# Patient Record
Sex: Male | Born: 1955
Health system: Southern US, Community
[De-identification: ages and names within clinical notes are randomized; demographics above are authoritative.]

## PROBLEM LIST (undated history)

## (undated) DIAGNOSIS — N453 Epididymo-orchitis: Secondary | ICD-10-CM

## (undated) DIAGNOSIS — I10 Essential (primary) hypertension: Secondary | ICD-10-CM

## (undated) HISTORY — DX: Epididymo-orchitis: N45.3

## (undated) HISTORY — DX: Essential (primary) hypertension: I10

---

## 2001-09-20 ENCOUNTER — Encounter: Payer: Self-pay | Admitting: Family Medicine

## 2001-09-20 ENCOUNTER — Encounter: Admission: RE | Admit: 2001-09-20 | Discharge: 2001-09-20 | Payer: Self-pay | Admitting: Family Medicine

## 2001-09-25 ENCOUNTER — Encounter: Payer: Self-pay | Admitting: Family Medicine

## 2001-09-25 ENCOUNTER — Encounter: Admission: RE | Admit: 2001-09-25 | Discharge: 2001-09-25 | Payer: Self-pay | Admitting: Family Medicine

## 2002-11-04 ENCOUNTER — Encounter: Payer: Self-pay | Admitting: Emergency Medicine

## 2002-11-04 ENCOUNTER — Emergency Department (HOSPITAL_COMMUNITY): Admission: AD | Admit: 2002-11-04 | Discharge: 2002-11-04 | Payer: Self-pay | Admitting: Emergency Medicine

## 2002-11-17 ENCOUNTER — Ambulatory Visit (HOSPITAL_COMMUNITY): Admission: RE | Admit: 2002-11-17 | Discharge: 2002-11-17 | Payer: Self-pay | Admitting: Urology

## 2003-01-10 HISTORY — PX: HAND SURGERY: SHX662

## 2003-02-14 ENCOUNTER — Observation Stay (HOSPITAL_COMMUNITY): Admission: EM | Admit: 2003-02-14 | Discharge: 2003-02-15 | Payer: Self-pay | Admitting: Emergency Medicine

## 2004-01-10 HISTORY — PX: OTHER SURGICAL HISTORY: SHX169

## 2005-01-13 IMAGING — CT CT ABDOMEN W/O CM
1 series · 15 of 32 positions shown, 19 images · non-contrast
Comparison: None.

CLINICAL DATA: Left flank pain. 

 CT ABDOMEN AND PELVIS, WITHOUT CONTRAST - 11/04/02 (8955 HOURS)
TECHNIQUE: 5 mm spiral images were obtained through the abdomen and pelvis without contrast.  
 CT ABDOMEN

[Series 2: renal stone · axial · 0.66mm/px · z∈[-432,-82]mm · 15 of 78 slices shown, 19 images]
[im 5/78  soft-tissue]
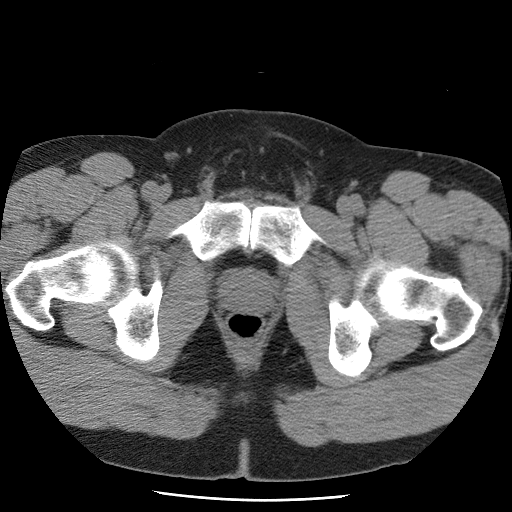
[im 5/78  bone]
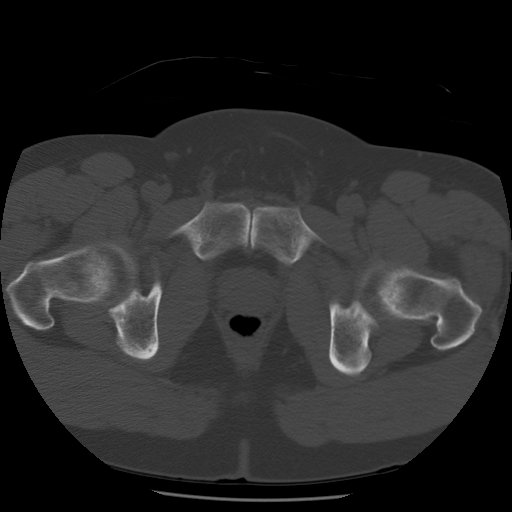
[im 10/78  soft-tissue]
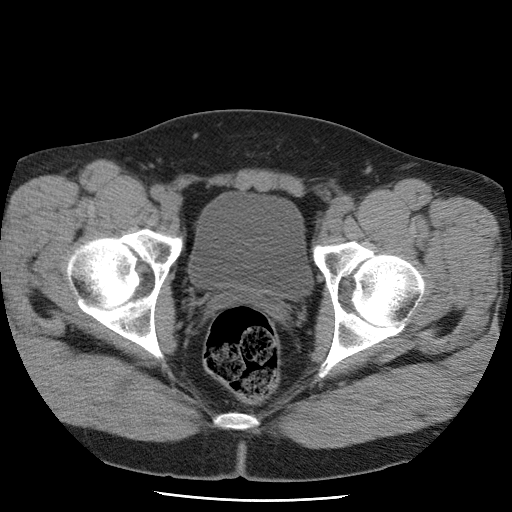
[im 15/78  soft-tissue]
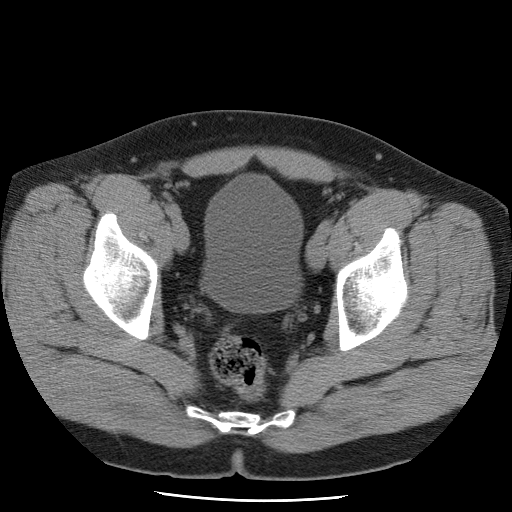
[im 23/78  soft-tissue]
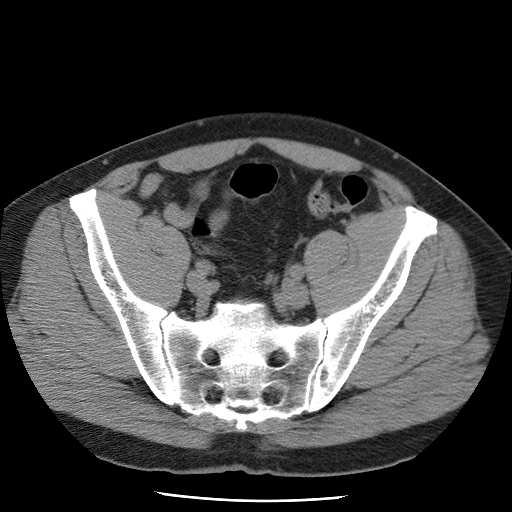
[im 28/78  soft-tissue]
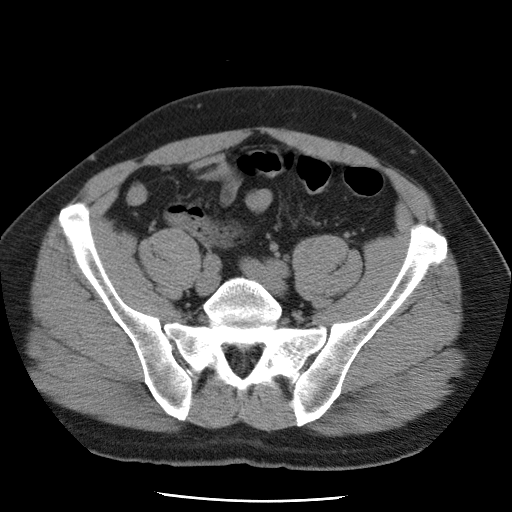
[im 33/78  soft-tissue]
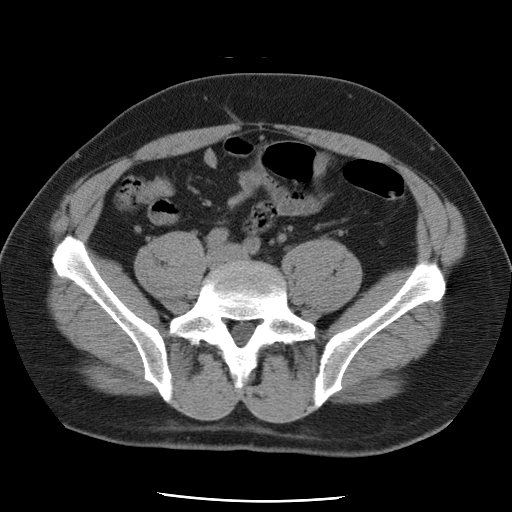
[im 40/78  soft-tissue]
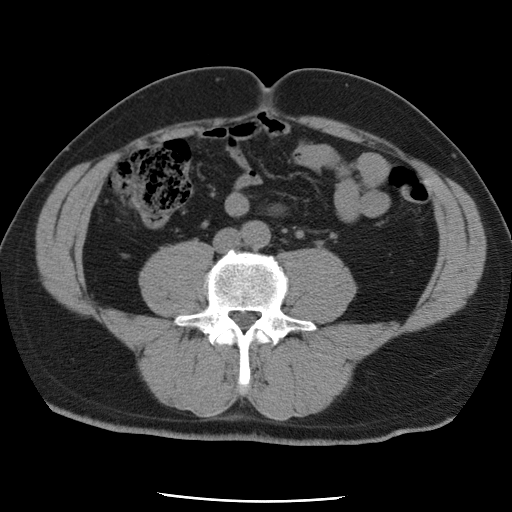
[im 45/78  soft-tissue]
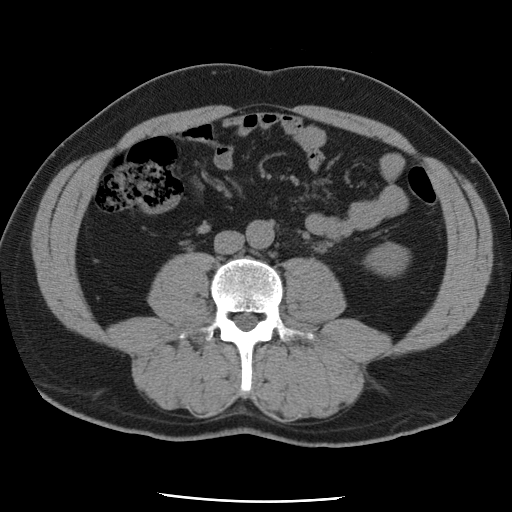
[im 50/78  soft-tissue]
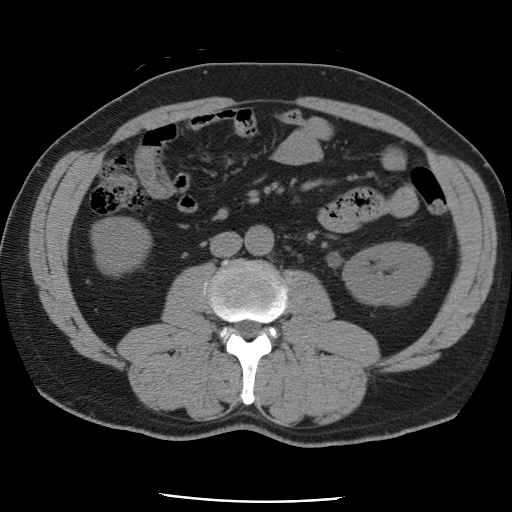
[im 50/78  bone]
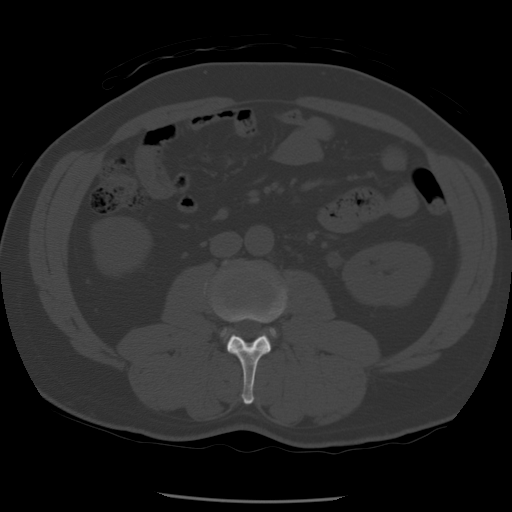
[im 55/78  soft-tissue]
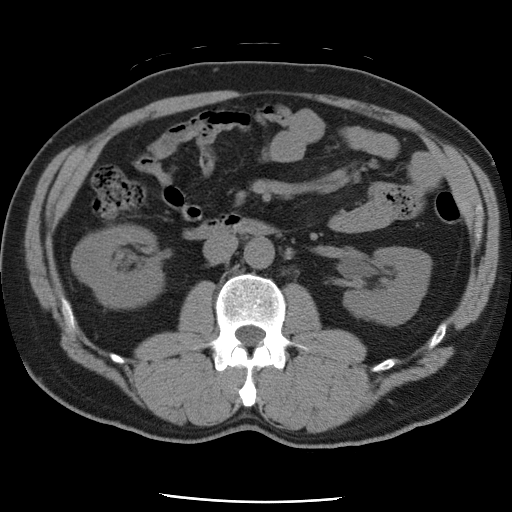
[im 63/78  soft-tissue]
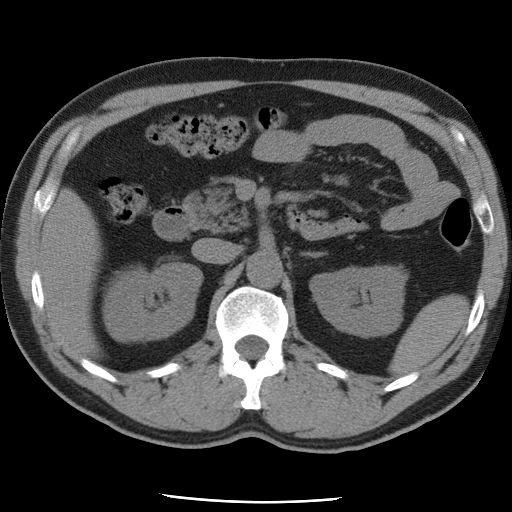
[im 68/78  soft-tissue]
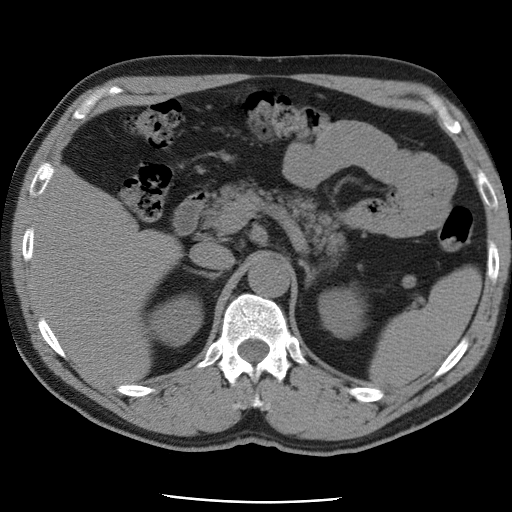
[im 68/78  lung]
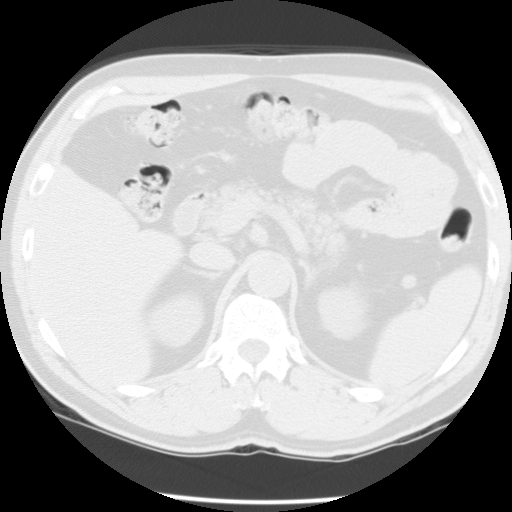
[im 70/78  lung]
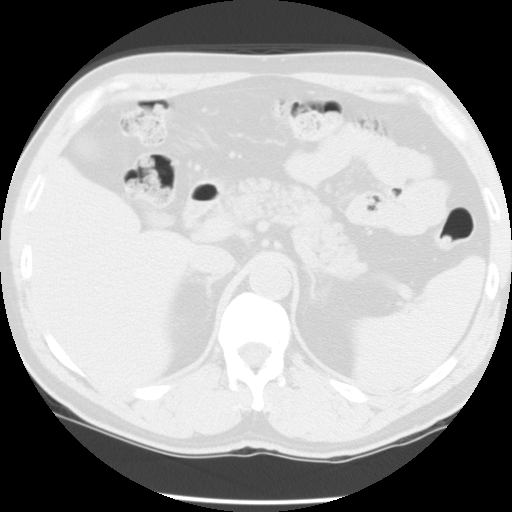
[im 73/78  soft-tissue]
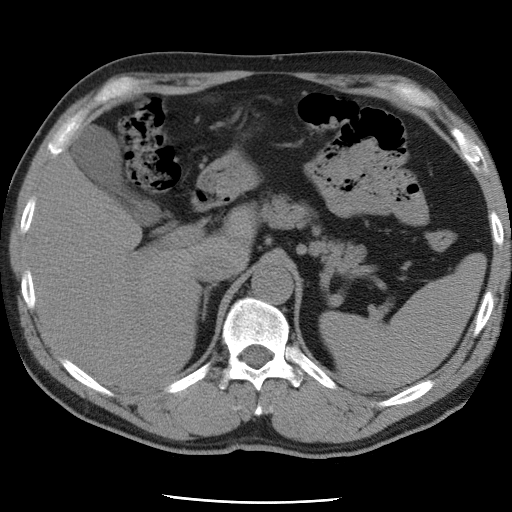
[im 73/78  lung]
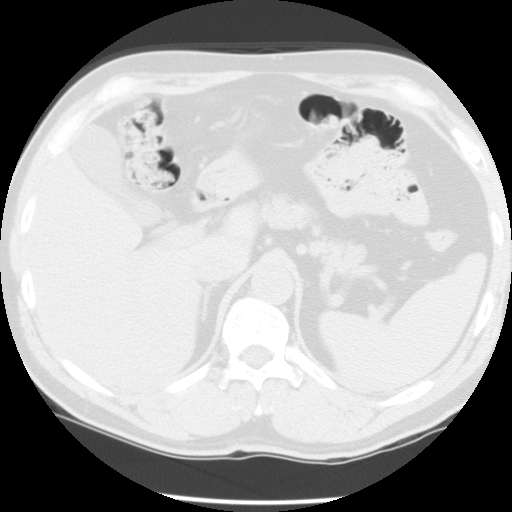
[im 75/78  lung]
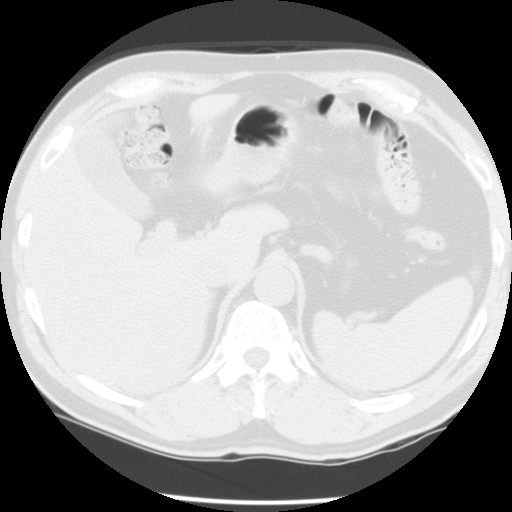

[15 of 32 positions shown; findings below may reference images not displayed]

FINDING: There is an 8 mm calculus in the lower pole of the left kidney.  There is no hydronephrosis or perinephric stranding.  There is however, slight stranding about the left ureter at the ureteropelvic junction and it is possible that this calculus may have traversed into the UPJ and back into the collecting system of the left kidney.  The right kidney.  The right kidney is within normal limits. 

 The gallbladder, pancreas and visualized portions of the spleen and liver are within normal limits. Negative free fluid.  Negative adenopathy.  

 IMPRESSION

 No ureteral calculi are seen.  

 There is slight stranding about the left ureter at the UPJ and it is possible that this calculus was in the ureter transiently and had traversed back into the lower pole collecting system on the left.  

 CT PELVIS
 The bladder and ureters are within normal limits. Negative free fluid.  Negative adenopathy.  Phleboliths are noted. 

 IMPRESSION
 No evidence of urinary calculus.

## 2008-08-13 ENCOUNTER — Ambulatory Visit: Payer: Self-pay | Admitting: Family Medicine

## 2008-08-13 DIAGNOSIS — I10 Essential (primary) hypertension: Secondary | ICD-10-CM | POA: Insufficient documentation

## 2008-08-13 HISTORY — DX: Essential (primary) hypertension: I10

## 2008-08-14 LAB — CONVERTED CEMR LAB
Albumin: 4.3 g/dL (ref 3.5–5.2)
Basophils Relative: 0.1 % (ref 0.0–3.0)
CO2: 30 meq/L (ref 19–32)
Chloride: 104 meq/L (ref 96–112)
Creatinine, Ser: 1.1 mg/dL (ref 0.4–1.5)
Eosinophils Absolute: 0.1 10*3/uL (ref 0.0–0.7)
HCT: 43.6 % (ref 39.0–52.0)
Hemoglobin: 15.3 g/dL (ref 13.0–17.0)
Lymphs Abs: 2.2 10*3/uL (ref 0.7–4.0)
MCHC: 35.2 g/dL (ref 30.0–36.0)
MCV: 87.8 fL (ref 78.0–100.0)
Monocytes Absolute: 0.7 10*3/uL (ref 0.1–1.0)
Neutro Abs: 4.6 10*3/uL (ref 1.4–7.7)
Neutrophils Relative %: 60.8 % (ref 43.0–77.0)
PSA: 0.14 ng/mL (ref 0.10–4.00)
RBC: 4.97 M/uL (ref 4.22–5.81)
Total CHOL/HDL Ratio: 5
Total Protein: 7.2 g/dL (ref 6.0–8.3)
Triglycerides: 97 mg/dL (ref 0.0–149.0)

## 2009-08-03 ENCOUNTER — Ambulatory Visit: Payer: Self-pay | Admitting: Family Medicine

## 2009-08-03 DIAGNOSIS — N453 Epididymo-orchitis: Secondary | ICD-10-CM

## 2009-08-03 HISTORY — DX: Epididymo-orchitis: N45.3

## 2009-09-21 ENCOUNTER — Ambulatory Visit: Payer: Self-pay | Admitting: Family Medicine

## 2009-09-21 LAB — CONVERTED CEMR LAB
ALT: 24 units/L (ref 0–53)
AST: 19 units/L (ref 0–37)
Albumin: 3.9 g/dL (ref 3.5–5.2)
Alkaline Phosphatase: 69 units/L (ref 39–117)
Basophils Absolute: 0.1 10*3/uL (ref 0.0–0.1)
Basophils Relative: 0.7 % (ref 0.0–3.0)
CO2: 33 meq/L — ABNORMAL HIGH (ref 19–32)
GFR calc non Af Amer: 89.49 mL/min (ref >60)
Glucose, Bld: 94 mg/dL (ref 70–99)
Glucose, Urine, Semiquant: NEGATIVE
HCT: 46.3 % (ref 39.0–52.0)
Hemoglobin: 15.8 g/dL (ref 13.0–17.0)
Lymphocytes Relative: 28.3 % (ref 12.0–46.0)
Lymphs Abs: 2.1 10*3/uL (ref 0.7–4.0)
Monocytes Relative: 7.6 % (ref 3.0–12.0)
Neutro Abs: 4.6 10*3/uL (ref 1.4–7.7)
Nitrite: NEGATIVE
Potassium: 3.9 meq/L (ref 3.5–5.1)
Protein, U semiquant: NEGATIVE
RBC: 5.1 M/uL (ref 4.22–5.81)
RDW: 12.9 % (ref 11.5–14.6)
Sodium: 143 meq/L (ref 135–145)
Specific Gravity, Urine: 1.015
TSH: 0.41 microintl units/mL (ref 0.35–5.50)
Total CHOL/HDL Ratio: 6
Total Protein: 6.6 g/dL (ref 6.0–8.3)
VLDL: 25.6 mg/dL (ref 0.0–40.0)
WBC Urine, dipstick: NEGATIVE
pH: 6

## 2009-09-27 ENCOUNTER — Ambulatory Visit: Payer: Self-pay | Admitting: Family Medicine

## 2009-09-29 ENCOUNTER — Ambulatory Visit: Payer: Self-pay | Admitting: Family Medicine

## 2009-09-29 LAB — CONVERTED CEMR LAB: OCCULT 2: NEGATIVE

## 2010-02-08 NOTE — Assessment & Plan Note (Signed)
Summary: CPX // RS   Vital Signs:  Patient profile:   55 year old male Height:      70 inches Weight:      211 pounds BMI:     30.38 O2 Sat:      96 % Temp:     98.5 degrees F oral Pulse rhythm:   regular Resp:     16 per minute BP sitting:   142 / 90  Vitals Entered By: Lynann Beaver CMA (September 27, 2009 2:05 PM)  Nutrition Counseling: Patient's BMI is greater than 25 and therefore counseled on weight management options. CC: cpx, Hypertension Management Is Patient Diabetic? No Pain Assessment Patient in pain? no        History of Present Illness: Patient for complete physical examination. Remote history of colonoscopy 2004. Recently completing Hemoccult cards and plans to send those back soon. Tetanus up-to-date. Flu vaccine offered and pt declines.  Hypertension treated with hydrochlorothiazide and quinapril. Patient compliant with medications. No regular exercise.  Hypertension History:      He denies headache, chest pain, palpitations, dyspnea with exertion, orthopnea, PND, peripheral edema, visual symptoms, neurologic problems, syncope, and side effects from treatment.  He notes no problems with any antihypertensive medication side effects.        Positive major cardiovascular risk factors include male age 42 years old or older and hypertension.  Negative major cardiovascular risk factors include non-tobacco-user status.     Clinical Review Panels:  Prevention   Last Colonoscopy:  normal (01/09/2002)   Last PSA:  0.14 (09/21/2009)  Immunizations   Last Tetanus Booster:  Historical (01/10/2003)  Lipid Management   Cholesterol:  147 (09/21/2009)   LDL (bad choesterol):  97 (09/21/2009)   HDL (good cholesterol):  24.50 (09/21/2009)  Diabetes Management   Creatinine:  0.9 (09/21/2009)  CBC   WBC:  7.4 (09/21/2009)   RBC:  5.10 (09/21/2009)   Hgb:  15.8 (09/21/2009)   Hct:  46.3 (09/21/2009)   Platelets:  262.0 (09/21/2009)   MCV  90.9 (09/21/2009)  MCHC  34.0 (09/21/2009)   RDW  12.9 (09/21/2009)   PMN:  61.9 (09/21/2009)   Lymphs:  28.3 (09/21/2009)   Monos:  7.6 (09/21/2009)   Eosinophils:  1.5 (09/21/2009)   Basophil:  0.7 (09/21/2009)  Complete Metabolic Panel   Glucose:  94 (09/21/2009)   Sodium:  143 (09/21/2009)   Potassium:  3.9 (09/21/2009)   Chloride:  105 (09/21/2009)   CO2:  33 (09/21/2009)   BUN:  12 (09/21/2009)   Creatinine:  0.9 (09/21/2009)   Albumin:  3.9 (09/21/2009)   Total Protein:  6.6 (09/21/2009)   Calcium:  9.0 (09/21/2009)   Total Bili:  0.7 (09/21/2009)   Alk Phos:  69 (09/21/2009)   SGPT (ALT):  24 (09/21/2009)   SGOT (AST):  19 (09/21/2009)   Allergies: 1)  Diphenhydramine Hcl (Diphenhydramine Hcl)  Past History:  Past Medical History: Last updated: 08/13/2008 Chicken pox Hay fever/allergies Hypertension Migraines Kidney stones  Past Surgical History: Last updated: 08/13/2008 Hand surgery 2005 crush injury  Lipotripsy for kidney stone 2006  Family History: Last updated: 08/13/2008 Family History of Alcoholism/Addiction Family History Breast cancer  Family History Diabetes  Family History Hypertension  Social History: Last updated: 08/13/2008 Occupation: currently unemployed Married Never Smoked Alcohol use-no  Risk Factors: Smoking Status: never (08/13/2008) PMH-FH-SH reviewed-no changes except otherwise noted  Review of Systems  The patient denies anorexia, fever, weight loss, weight gain, vision loss, decreased hearing, hoarseness, chest pain,  syncope, dyspnea on exertion, peripheral edema, prolonged cough, headaches, hemoptysis, abdominal pain, melena, hematochezia, severe indigestion/heartburn, hematuria, incontinence, genital sores, muscle weakness, suspicious skin lesions, transient blindness, difficulty walking, depression, unusual weight change, abnormal bleeding, enlarged lymph nodes, and testicular masses.    Physical Exam  General:   Well-developed,well-nourished,in no acute distress; alert,appropriate and cooperative throughout examination Head:  Normocephalic and atraumatic without obvious abnormalities. No apparent alopecia or balding. Eyes:  pupils equal, pupils round, and pupils reactive to light.   Ears:  External ear exam shows no significant lesions or deformities.  Otoscopic examination reveals clear canals, tympanic membranes are intact bilaterally without bulging, retraction, inflammation or discharge. Hearing is grossly normal bilaterally. Mouth:  Oral mucosa and oropharynx without lesions or exudates.  Teeth in good repair. Neck:  No deformities, masses, or tenderness noted. Lungs:  Normal respiratory effort, chest expands symmetrically. Lungs are clear to auscultation, no crackles or wheezes. Heart:  Normal rate and regular rhythm. S1 and S2 normal without gallop, murmur, click, rub or other extra sounds. Abdomen:  Bowel sounds positive,abdomen soft and non-tender without masses, organomegaly or hernias noted. Extremities:  No clubbing, cyanosis, edema, or deformity noted with normal full range of motion of all joints.   Neurologic:  alert & oriented X3, cranial nerves II-XII intact, strength normal in all extremities, and gait normal.   Skin:  no rashes and no suspicious lesions.   Cervical Nodes:  No lymphadenopathy noted Psych:  good eye contact, not anxious appearing, and not depressed appearing.     Impression & Recommendations:  Problem # 1:  ROUTINE GENERAL MEDICAL [email or web address]@[email or web address]  CARE FACL (ICD-V70.0) establish more regular exercise. Patient declines flu vaccine. Finish out Hemoccult cards. Labs reviewed with patient.  Problem # 2:  HYPERTENSION (ICD-401.9) needs to work on weight loss. Reassess in 6 months and if up at that time consider additional medication His updated medication list for this problem includes:    Quinapril Hcl 40 Mg Tabs (Quinapril hcl) ..... Once daily    Hydrochlorothiazide 25 Mg  Tabs (Hydrochlorothiazide) .Marland Kitchen... 1/2 tab daily  Complete Medication List: 1)  Quinapril Hcl 40 Mg Tabs (Quinapril hcl) .... Once daily 2)  Hydrochlorothiazide 25 Mg Tabs (Hydrochlorothiazide) .... 1/2 tab daily  Hypertension Assessment/Plan:      The patient's hypertensive risk group is category B: At least one risk factor (excluding diabetes) with no target organ damage.  His calculated 10 year risk of coronary heart disease is 9 %.  Today's blood pressure is 142/90.  His blood pressure goal is < 140/90.  Patient Instructions: 1)  Please schedule a follow-up appointment in 6 months .  2)  It is important that you exercise reguarly at least 20 minutes 5 times a week. If you develop chest pain, have severe difficulty breathing, or feel very tired, stop exercising immediately and seek medical attention.  3)  You need to lose weight. Consider a lower calorie diet and regular exercise.  4)  Check your  Blood Pressure regularly . If it is above: 140/90  you should make an appointment. Prescriptions: HYDROCHLOROTHIAZIDE 25 MG TABS (HYDROCHLOROTHIAZIDE) 1/2 tab daily  #45 x 3   Entered and Authorized by:   Evelena Peat MD   Signed by:   Evelena Peat MD on 09/27/2009   Method used:   Electronically to        [facility]  [address] [phone] (retail)       [address]  [place], [place]  [address]       Ph: [phone] or [phone]       Fax: [phone]   RxID:   [id number] QUINAPRIL HCL 40 MG TABS (QUINAPRIL HCL) once daily  #90 x 3   Entered and Authorized by:   Evelena Peat MD   Signed by:   Evelena Peat MD on 09/27/2009   Method used:   Electronically to        [facility]  [address] (retail)       [address]       [place], [place]  [address]       Ph: [phone] or [phone]       Fax: [phone]   RxID:   [id number]

## 2010-02-08 NOTE — Assessment & Plan Note (Signed)
Summary: ?hernia/njr   Vital Signs:  Patient profile:   55 year old male Temp:     98.7 degrees F oral BP sitting:   140 / 92  (left arm) Cuff size:   regular  Vitals Entered By: Sid Falcon LPN (August 03, 2009 10:10 AM) CC: questioning hernia   History of Present Illness: Patient seen with a minimal amount of scrotal pain which started on 07/25/09. He thought he may have a hernia but did not have any injury and no obvious bulge or swelling. His pain is sharp to achy quality mild to moderate severity. Symptoms are intermittent. Is worse lying on his left side. No dysuria.  no fever or chills. Has not taken any medications for this.  Allergies: 1)  Diphenhydramine Hcl (Diphenhydramine Hcl)  Past History:  Past Medical History: Last updated: 08/13/2008 Chicken pox Hay fever/allergies Hypertension Migraines Kidney stones  Review of Systems      See HPI  Physical Exam  General:  Well-developed,well-nourished,in no acute distress; alert,appropriate and cooperative throughout examination Genitalia:  circumcised.  testes are normal in size and contour. Does have some left epididymis tenderness. No evidence for hernia. No other masses palpated.   Impression & Recommendations:  Problem # 1:  EPIDIDYMITIS (ICD-604.90) ?noninfectious.  Trial of NSAID and if no better consider rx with Doxycycline.  Complete Medication List: 1)  Quinapril Hcl 40 Mg Tabs (Quinapril hcl) .... Once daily 2)  Hydrochlorothiazide 25 Mg Tabs (Hydrochlorothiazide) .... 1/2 tab daily 3)  Doxycycline Hyclate 100 Mg Caps (Doxycycline hyclate) .... One by mouth two times a day for 10 days  Patient Instructions: 1)  Try ibuprofen 4-600 mg 2-3 times daily. 2)  Start antibiotic if no relief with nonsteroidals after 2 or 3 days 3)  Schedule complete physical examination Prescriptions: DOXYCYCLINE HYCLATE 100 MG CAPS (DOXYCYCLINE HYCLATE) one by mouth two times a day for 10 days  #20 x 0   Entered and  Authorized by:   Evelena Peat MD   Signed by:   Evelena Peat MD on 08/03/2009   Method used:   Print then Give to Patient   RxID:   [id number]

## 2010-03-25 ENCOUNTER — Encounter: Payer: Self-pay | Admitting: Family Medicine

## 2010-03-28 ENCOUNTER — Encounter: Payer: Self-pay | Admitting: Family Medicine

## 2010-03-28 ENCOUNTER — Ambulatory Visit (INDEPENDENT_AMBULATORY_CARE_PROVIDER_SITE_OTHER): Payer: 59 | Admitting: Family Medicine

## 2010-03-28 VITALS — BP 140/92 | HR 98 | Temp 98.0°F | Resp 12 | Ht 70.75 in | Wt 212.0 lb

## 2010-03-28 DIAGNOSIS — I10 Essential (primary) hypertension: Secondary | ICD-10-CM

## 2010-03-28 MED ORDER — QUINAPRIL HCL 40 MG PO TABS: 40.0000 mg | ORAL_TABLET | Freq: Every day | ORAL | Status: DC

## 2010-03-28 MED ORDER — HYDROCHLOROTHIAZIDE 25 MG PO TABS: 25.0000 mg | ORAL_TABLET | Freq: Every day | ORAL | Status: DC

## 2010-03-28 NOTE — Progress Notes (Signed)
  Subjective:    Patient ID: Peter Hughes, male    DOB: 1955/10/12, 55 y.o.   MRN: [id number]  HPI  patient here for followup hypertension. Medications reviewed. No side effects. Compliant with therapy. Not compliant with exercise. Weight is essentially unchanged since visit several months ago. Tries to watch salt intake closely. Denies any dizziness, headaches, or chest pain. Nonsmoker. No regular alcohol use.   Review of Systems  Constitutional: Negative for fatigue.  Eyes: Negative for visual disturbance.  Respiratory: Negative for cough, chest tightness and shortness of breath.   Cardiovascular: Negative for chest pain, palpitations and leg swelling.  Neurological: Negative for dizziness, syncope, weakness, light-headedness and headaches.       Objective:   Physical Exam  Constitutional: He is oriented to person, place, and time. He appears well-developed and well-nourished. No distress.  HENT:  Head: Normocephalic and atraumatic.  Eyes: Conjunctivae and EOM are normal. Pupils are equal, round, and reactive to light.  Neck: Normal range of motion. Neck supple. No thyromegaly present.  Cardiovascular: Normal rate and regular rhythm.   No murmur heard. Pulmonary/Chest: No respiratory distress.  Musculoskeletal: He exhibits no edema.  Lymphadenopathy:    He has no cervical adenopathy.  Neurological: He is oriented to person, place, and time. No cranial nerve deficit.  Skin: No rash noted.          Assessment & Plan:   Hypertension. Not well-controlled. Long discussion with patient regarding options. Additional medication versus lifestyle management and weight loss and he prefers the latter. Reassess blood pressure in 6 months. Refills given until then

## 2010-03-28 NOTE — Patient Instructions (Signed)

## 2010-05-27 NOTE — Op Note (Signed)
NAME:  Peter Hughes, Peter Hughes                          ACCOUNT NO.:  [id number]   MEDICAL RECORD NO.:  [id number]                   PATIENT TYPE:  INP   LOCATION:  5708                                 FACILITY:  [facility]   PHYSICIAN:  Dionne Ano. Everlene Other, M.D.         DATE OF BIRTH:  05/24/1955   DATE OF PROCEDURE:  DATE OF DISCHARGE:  02/15/2003                                 OPERATIVE REPORT   PREOPERATIVE DIAGNOSIS:  Status post steel blade injury to the right hand  with amputations about the ring and middle finger as well as open fracture  dislocation about the right index finger. This was a mangling type injury to  the right hand with multiple  amputations and fracture of an  open nature  with nailbed disruption about the index finger.   POSTOPERATIVE DIAGNOSIS:  Status post steel blade injury to the right hand  with amputations about the ring and middle finger as well as open fracture  dislocation about the right index finger. This was a mangling type injury to  the right hand with multiple  amputations and fracture of an  open nature  with nailbed disruption about the index finger.   PROCEDURES:  1. Irrigation and debridement open fracture right index finger distal     phalanx.  2. Open reduction and internal fixation distal phalanx fracture right index     finger.  3. Nailbed repair right index finger.  4. Irrigation and debridement open amputation right middle finger at the P2     level (middle phalanx level).  5. Revision amputation with flap coverage including  bilateral neurectomies     right middle finger.  6. Irrigation and debridement open fracture distal phalanx, right ring     finger.  7. Nailbed repair right ring finger.  8. Flap coverage right ring finger with Kutler flap coverage over the distal     phalanx exposed bony architecture.  9. A 1 x 1 cm full thickness skin graft about the right ring finger.   SURGEON:  Dionne Ano. Amanda Pea, M.D.   ASSISTANT:  None.   COMPLICATIONS:  None.   ANESTHESIA:  General.   TOURNIQUET TIME:  Less than 90 minutes.   DRAINS:  None.   INDICATIONS FOR PROCEDURE:  This patient is a very pleasant male who  unfortunately sustained a mangling injury to his hand today  when a steel  blade hit it. He sustained amputations at the P2 region of the middle finger  and the distal  phalanx  region of the ring finger. The index finger  has an  open fracture with nailbed disruption.   I have counseled him in regards to the risks and benefits of the surgery  including  the risks of infection, bleeding, anesthesia, damage to normal  structures and failure of the surgery to accomplish its intended goals of  relieving symptoms and restoring function.  With this in mind he  desires to proceed. All questions have been encouraged  and answered preoperatively. The patient understands meaningful and  realistic postoperative goals with this type of mangling injury and we have  given him Ancef already and addressed tetanus status. I have discussed  with  him all risks and benefits, dos and don'ts etc. We will proceed urgently to  the operative suite.   DESCRIPTION OF PROCEDURE:  The patient was seen by myself and anesthesia. He  was taken to the operative suite and underwent  the smooth induction of  general anesthesia. He was laid supine and appropriately padded. A  tourniquet was placed about the right upper extremity. Ancef was given and  the patient then underwent  a thorough Betadine scrub and paint.   Following  securing of the sterile field, the patient underwent  sequential  irrigation and debridement of the mangled hand. Irrigation and debridement  of an open fracture about the distal  phalanx  of the right index finger  was performed and this was an I and D of the open fracture nail bed and  included removal of particulate matter with curet. A copious amount of  lavage was placed in the area. This was done to my  satisfaction without  difficulty.   Once this was done, attention was turned towards the middle finger, where an  I and D of skin, subcutaneous tissue and bone occurred. The patient had  exposed bone about the middle phalanx  and a crushed distal  phalanx. He had  no meaningful or realistic chance of having any type of salvage of the digit  other than revision amputation with flap coverage. Thus I and D of  subcutaneous tissue, bone, tendinous tissue  and bilateral neurectomies were  performed. The patient tolerated this well with multiple amounts of saline.   Once this was done attention was turned towards the ring finger, where I and  D of the open distal phalanx  was accomplished. The patient did not have any  meaningful distal attachments whatsoever and had greater than 60% loss of  the distal  phalanx. This was irrigated and debrided with copious amounts of  irrigation and included  removal of particulate matter. Thus I and Ds of the  index, middle and ring finger were accomplished  with greater than  3 liters  of saline.   Following  this we performed open reduction and internal fixation of the  right index finger. We allowed for interdigitation of the bone without  difficulty, and once this was done, sutured the lateral  flaps with 4-0  chromic suture. Following  this we performed a nail bed repair under 4.0  loupe magnification about the right index finger  utilizing 6-0 chromic. The  patient tolerated this well and the index finger  had good refill and was  viable. Adaptic was placed under the eponychial fold to prevent nail bed  adherence.   Following this the patient had  a revision amputation of the right middle  finger. A radially based flap was created and allowed to cover the bone  which was trimmed. The patient did not have any meaningful nail bed of  course. The flap was allowed to be inset without difficulty and combination chromic and Prolene was used for this. The  patient tolerated this well, had  good refill to the flap which was meticulously dissected and mobilized  without difficulty.   Following  this the patient underwent flap coverage of the right ring finger  over the exposed bony region. As performed with the middle finger  and ring  finger, the rongeur and bone cutting instruments were used to trim the bone  so that there would be a smooth surface and no jagged edges. This was done  to my satisfaction.   Following  this an ulnarly based Kutler flap was mobilized under 4.0 loupe  magnification with the tourniquet insufflated. The patient tolerated the  mobilization portion quite well and following mobilizing the flap, it was  then advanced  over the bone and inset with a combination chromic and  Prolene suture.   I then performed a nailbed repair and under 4.0 loupe magnification with 6-0  chromic about this region. There was a small  nail left and I do think that  he will have a nail remnant which may or may not be particularly helpful for  the patient, however, in my experience, many patients desire to have a nail  for purposes of holding onto objects and allowing some prehensile  activities.   Once this was done, an area just proximal  to this that did not have exposed  bone was treated with a 1 x 1 cm skin graft. The skin graft was taken from  remnants from the middle finger. I removed the skin, defatted it and then  performed a full thickness skin graft and inset this with chromic and  Prolene without difficulty about the right ring finger. He tolerated  this  well without complications.   The tourniquet was deflated. All wounds were washed with warm saline once  again, and the Kutler flap looked excellent with excellent refill. I was  pleased with this. The refill to all flaps including  the radially based  flaps  about the middle finger. At this point in time I placed Marcaine in  the inter metacarpal region for postoperative  analgesia. Approximately 15 mL  were used. Following this Adaptic followed  by Xeroform gauze followed  by  fluff gauze, Kerlix and a volar plaster splint was applied without  difficulty.   The patient tolerated this well without difficulty. All sponge, instrument  and needle counts were reported as correct. He will be monitored in the  recovery room and admitted to the floor for IV antibiotics, elevation,  postoperative observation and general measures. I have discussed  with the  family the dos and do not's etc. We will do our best to try and give him an  excellent functioning hand into future, and I have reassured them of this.   It has been a pleasure to participate in his care. I look forward in  participating in his  postoperative recovery.                                               Dionne Ano. Everlene Other, M.D.    Nash Mantis  D:  02/14/2003  T:  02/15/2003  Job:  [id number]

## 2010-09-28 ENCOUNTER — Ambulatory Visit (INDEPENDENT_AMBULATORY_CARE_PROVIDER_SITE_OTHER): Payer: 59 | Admitting: Family Medicine

## 2010-09-28 ENCOUNTER — Encounter: Payer: Self-pay | Admitting: Family Medicine

## 2010-09-28 VITALS — BP 150/90 | Temp 97.9°F | Wt 214.0 lb

## 2010-09-28 DIAGNOSIS — I10 Essential (primary) hypertension: Secondary | ICD-10-CM

## 2010-09-28 MED ORDER — AMLODIPINE BESYLATE 5 MG PO TABS: 5.0000 mg | ORAL_TABLET | Freq: Every day | ORAL | Status: DC

## 2010-09-28 NOTE — Progress Notes (Signed)
  Subjective:    Patient ID: Peter Hughes, male    DOB: April 01, 1955, 55 y.o.   MRN: [id number]  HPI Follow up hypertension. Not monitoring blood pressures. Takes HCTZ 12.5 mg daily and quinapril 40 mg daily. Compliant with therapy. 2 pound weight gain since last visit. No consistent exercise. No excessive alcohol use. No nonsteroidal use. Denies any headaches or dizziness. No chest pains.  Past Medical History  Diagnosis Date  . HYPERTENSION 08/13/2008  . EPIDIDYMITIS 08/03/2009   Past Surgical History  Procedure Date  . Lipotripsy 2006    Kidney stone  . Hand surgery 2005    crush injury    reports that he has never smoked. He does not have any smokeless tobacco history on file. His alcohol and drug histories not on file. family history includes Alcohol abuse in his other; Cancer in his other; Diabetes in his other; and Hypertension in his other. Allergies  Allergen Reactions  . Diphenhydramine Hcl     Reaction: agitation, a cough green syrup      Review of Systems  Constitutional: Negative for fatigue.  Eyes: Negative for visual disturbance.  Respiratory: Negative for cough, chest tightness and shortness of breath.   Cardiovascular: Negative for chest pain, palpitations and leg swelling.  Neurological: Negative for dizziness, syncope, weakness, light-headedness and headaches.       Objective:   Physical Exam  Constitutional: He is oriented to person, place, and time. He appears well-developed and well-nourished.  Neck: Neck supple. No thyromegaly present.  Cardiovascular: Normal rate, regular rhythm and normal heart sounds.   No murmur heard. Pulmonary/Chest: Effort normal and breath sounds normal. No respiratory distress. He has no wheezes. He has no rales.  Musculoskeletal: He exhibits no edema.  Lymphadenopathy:    He has no cervical adenopathy.  Neurological: He is alert and oriented to person, place, and time.          Assessment & Plan:  Hypertension.  Suboptimal control. Add amlodipine 5 mg daily. Continue other medications. Work on weight loss. Reassess one month

## 2010-09-28 NOTE — Patient Instructions (Signed)

## 2010-10-24 ENCOUNTER — Ambulatory Visit (INDEPENDENT_AMBULATORY_CARE_PROVIDER_SITE_OTHER): Payer: 59 | Admitting: Family Medicine

## 2010-10-24 ENCOUNTER — Encounter: Payer: Self-pay | Admitting: Family Medicine

## 2010-10-24 VITALS — BP 130/80 | Temp 98.0°F | Wt 214.0 lb

## 2010-10-24 DIAGNOSIS — I10 Essential (primary) hypertension: Secondary | ICD-10-CM

## 2010-10-24 NOTE — Progress Notes (Signed)
  Subjective:    Patient ID: Peter Hughes, male    DOB: 1955/03/22, 55 y.o.   MRN: [id number]  HPI Followup hypertension. Added amlodipine 5 mg last visit. Compliant with all medications. Only very minimal edema. No headaches. Denies dizziness. No orthostatic symptoms. Patient denies any chest pains. Walks some for exercise but not consistently.  Past Medical History  Diagnosis Date  . HYPERTENSION 08/13/2008  . EPIDIDYMITIS 08/03/2009   Past Surgical History  Procedure Date  . Lipotripsy 2006    Kidney stone  . Hand surgery 2005    crush injury    reports that he has never smoked. He does not have any smokeless tobacco history on file. His alcohol and drug histories not on file. family history includes Alcohol abuse in his other; Cancer in his other; Diabetes in his other; and Hypertension in his other. Allergies  Allergen Reactions  . Diphenhydramine Hcl     Reaction: agitation, a cough green syrup      Review of Systems  Constitutional: Negative for fatigue.  HENT: Positive for trouble swallowing.   Eyes: Negative for visual disturbance.  Respiratory: Negative for cough, chest tightness, shortness of breath and wheezing.   Cardiovascular: Negative for chest pain, palpitations and leg swelling.  Gastrointestinal: Negative for abdominal pain.  Skin: Negative for rash.  Neurological: Negative for dizziness, syncope, weakness, light-headedness and headaches.       Objective:   Physical Exam  Constitutional: He appears well-developed and well-nourished.  Neck: Neck supple. No thyromegaly present.  Cardiovascular: Normal rate, regular rhythm and normal heart sounds.   No murmur heard. Pulmonary/Chest: Effort normal and breath sounds normal. No respiratory distress. He has no wheezes. He has no rales.  Musculoskeletal: He exhibits no edema.          Assessment & Plan:  Hypertension. Improved. Continue current medications. Establish more regular exercise. Routine  followup 6 months

## 2010-10-24 NOTE — Patient Instructions (Signed)
Exercise regularly- especially aerobic exercise such as walking. Limit sodium to about 2 grams per day.

## 2011-04-01 ENCOUNTER — Other Ambulatory Visit: Payer: Self-pay | Admitting: Family Medicine

## 2011-04-05 ENCOUNTER — Other Ambulatory Visit: Payer: Self-pay | Admitting: Family Medicine

## 2011-04-25 ENCOUNTER — Ambulatory Visit (INDEPENDENT_AMBULATORY_CARE_PROVIDER_SITE_OTHER): Payer: 59 | Admitting: Family Medicine

## 2011-04-25 ENCOUNTER — Encounter: Payer: Self-pay | Admitting: Family Medicine

## 2011-04-25 VITALS — BP 130/80 | HR 80 | Temp 97.9°F | Resp 12 | Ht 70.0 in | Wt 220.0 lb

## 2011-04-25 DIAGNOSIS — Z0000 Encounter for general adult medical examination without abnormal findings: Secondary | ICD-10-CM

## 2011-04-25 LAB — CBC WITH DIFFERENTIAL/PLATELET
Basophils Absolute: 0 10*3/uL (ref 0.0–0.1)
Eosinophils Absolute: 0.1 10*3/uL (ref 0.0–0.7)
Lymphocytes Relative: 30.6 % (ref 12.0–46.0)
MCHC: 32.7 g/dL (ref 30.0–36.0)
Monocytes Absolute: 0.6 10*3/uL (ref 0.1–1.0)
Neutro Abs: 4.2 10*3/uL (ref 1.4–7.7)
Neutrophils Relative %: 59.1 % (ref 43.0–77.0)
RDW: 13.1 % (ref 11.5–14.6)

## 2011-04-25 LAB — LIPID PANEL
Cholesterol: 144 mg/dL (ref 0–200)
LDL Cholesterol: 98 mg/dL (ref 0–99)
Triglycerides: 98 mg/dL (ref 0.0–149.0)
VLDL: 19.6 mg/dL (ref 0.0–40.0)

## 2011-04-25 LAB — POCT URINALYSIS DIPSTICK
Bilirubin, UA: NEGATIVE
Glucose, UA: NEGATIVE
Ketones, UA: NEGATIVE
Spec Grav, UA: 1.02

## 2011-04-25 LAB — HEPATIC FUNCTION PANEL
Bilirubin, Direct: 0.1 mg/dL (ref 0.0–0.3)
Total Bilirubin: 0.5 mg/dL (ref 0.3–1.2)
Total Protein: 6.8 g/dL (ref 6.0–8.3)

## 2011-04-25 LAB — BASIC METABOLIC PANEL
Chloride: 105 mEq/L (ref 96–112)
Potassium: 4.2 mEq/L (ref 3.5–5.1)
Sodium: 140 mEq/L (ref 135–145)

## 2011-04-25 MED ORDER — AMLODIPINE BESYLATE 5 MG PO TABS: 5.0000 mg | ORAL_TABLET | Freq: Every day | ORAL | Status: DC

## 2011-04-25 MED ORDER — QUINAPRIL HCL 40 MG PO TABS: 40.0000 mg | ORAL_TABLET | Freq: Every day | ORAL | Status: DC

## 2011-04-25 MED ORDER — HYDROCHLOROTHIAZIDE 25 MG PO TABS: 25.0000 mg | ORAL_TABLET | Freq: Every day | ORAL | Status: DC

## 2011-04-25 NOTE — Progress Notes (Signed)
  Subjective:    Patient ID: Peter Hughes, male    DOB: 1955/02/14, 56 y.o.   MRN: [id number]  HPI  Patient seen for complete physical. History hypertension treated with 3 drug regimen. Compliant with medications. No side effects. Denies any headaches or dizziness. Tetanus up-to-date. Colonoscopy 9 years ago. He refuses further colonoscopy because of having a friend with perforation with screening colonoscopy. Denies any change of bowel habits. No consistent exercise. Not monitoring blood pressures regularly.  Past Medical History  Diagnosis Date  . HYPERTENSION 08/13/2008  . EPIDIDYMITIS 08/03/2009   Past Surgical History  Procedure Date  . Lipotripsy 2006    Kidney stone  . Hand surgery 2005    crush injury    reports that he has never smoked. He does not have any smokeless tobacco history on file. His alcohol and drug histories not on file. family history includes Alcohol abuse in his other; Cancer in his other; Diabetes in his other; and Hypertension in his other. Allergies  Allergen Reactions  . Benadryl (Altaryl)     agitation  . Diphenhydramine Hcl     Reaction: agitation, a cough green syrup      Review of Systems  Constitutional: Negative for fever, activity change, appetite change and fatigue.  HENT: Negative for ear pain, congestion and trouble swallowing.   Eyes: Negative for pain and visual disturbance.  Respiratory: Negative for cough, shortness of breath and wheezing.   Cardiovascular: Negative for chest pain and palpitations.  Gastrointestinal: Negative for nausea, vomiting, abdominal pain, diarrhea, constipation, blood in stool, abdominal distention and rectal pain.  Genitourinary: Negative for dysuria, hematuria and testicular pain.  Musculoskeletal: Negative for joint swelling and arthralgias.  Skin: Negative for rash.  Neurological: Negative for dizziness, syncope and headaches.  Hematological: Negative for adenopathy.  Psychiatric/Behavioral: Negative for  confusion and dysphoric mood.       Objective:   Physical Exam  Constitutional: He is oriented to person, place, and time. He appears well-developed and well-nourished. No distress.  HENT:  Head: Normocephalic and atraumatic.  Right Ear: External ear normal.  Left Ear: External ear normal.  Mouth/Throat: Oropharynx is clear and moist.  Eyes: Conjunctivae and EOM are normal. Pupils are equal, round, and reactive to light.  Neck: Normal range of motion. Neck supple. No thyromegaly present.  Cardiovascular: Normal rate, regular rhythm and normal heart sounds.   No murmur heard. Pulmonary/Chest: No respiratory distress. He has no wheezes. He has no rales.  Abdominal: Soft. Bowel sounds are normal. He exhibits no distension and no mass. There is no tenderness. There is no rebound and no guarding.  Musculoskeletal: He exhibits no edema.  Lymphadenopathy:    He has no cervical adenopathy.  Neurological: He is alert and oriented to person, place, and time. He displays normal reflexes. No cranial nerve deficit.  Skin: No rash noted.  Psychiatric: He has a normal mood and affect.          Assessment & Plan:  Complete physical. Tetanus up-to-date. Discussed consistent exercise. Screening labs obtained. Patient refuses further colonoscopy.  BP medications refilled.

## 2011-04-26 NOTE — Progress Notes (Signed)
Quick Note:  Pt informed on home Vm ______ 

## 2011-07-05 ENCOUNTER — Telehealth: Payer: Self-pay | Admitting: Family Medicine

## 2011-07-05 NOTE — Telephone Encounter (Signed)
Caller: Kross/Patient; PCP: Evelena Peat; CB#: [phone];  Call regarding Tick Bite L. Thigh; Onset 07/05/11. Afebrile. No current symptoms. Home care advice given for tick bite per Insect or Spider Bites or Stings Guideline.

## 2012-01-15 ENCOUNTER — Other Ambulatory Visit: Payer: Self-pay | Admitting: Family Medicine

## 2012-03-22 ENCOUNTER — Encounter: Payer: Self-pay | Admitting: Family Medicine

## 2012-03-22 ENCOUNTER — Ambulatory Visit (INDEPENDENT_AMBULATORY_CARE_PROVIDER_SITE_OTHER): Payer: 59 | Admitting: Family Medicine

## 2012-03-22 VITALS — BP 130/82 | HR 78 | Temp 98.0°F | Wt 222.0 lb

## 2012-03-22 DIAGNOSIS — R21 Rash and other nonspecific skin eruption: Secondary | ICD-10-CM

## 2012-03-22 MED ORDER — TRIAMCINOLONE ACETONIDE 0.1 % EX CREA: TOPICAL_CREAM | Freq: Two times a day (BID) | CUTANEOUS | Status: DC

## 2012-03-22 NOTE — Patient Instructions (Addendum)
Pityriasis Rosea Pityriasis rosea is a rash which is probably caused by a virus. It generally starts as a scaly, red patch on the trunk (the area of the body that a t-shirt would cover) but does not appear on sun exposed areas. The rash is usually preceded by an initial larger spot called the "herald patch" a week or more before the rest of the rash appears. Generally within one to two days the rash appears rapidly on the trunk, upper arms, and sometimes the upper legs. The rash usually appears as flat, oval patches of scaly pink color. The rash can also be raised and one is able to feel it with a finger. The rash can also be finely crinkled and may slough off leaving a ring of scale around the spot. Sometimes a mild sore throat is present with the rash. It usually affects children and young adults in the spring and autumn. Women are more frequently affected than men. TREATMENT  Pityriasis rosea is a self-limited condition. This means it goes away within 4 to 8 weeks without treatment. The spots may persist for several months, especially in darker-colored skin after the rash has resolved and healed. Benadryl and steroid creams may be used if itching is a problem. SEEK MEDICAL CARE IF:   Your rash does not go away or persists longer than three months.  You develop fever and joint pain.  You develop severe headache and confusion.  You develop breathing difficulty, vomiting and/or extreme weakness. Document Released: 02/01/2001 Document Revised: 03/20/2011 Document Reviewed: 02/21/2008 [employer] Patient Information 2013 [employer].  

## 2012-03-22 NOTE — Progress Notes (Signed)
Chief Complaint  Patient presents with  . Rash    HPI:  Rash: -started 3 weeks ago -on sides of abd, itchy rash -seems to have spread a little -does outside work - not [place] [employer] contact that he is aware of -also itchy on arms and head but no rash there -no new exposures he can think of other then new detergent -has hx of rash under arms from [employer] in the past -no fevers, chills, malaise ROS: See pertinent positives and negatives per HPI.  Past Medical History  Diagnosis Date  . HYPERTENSION 08/13/2008  . EPIDIDYMITIS 08/03/2009    Family History  Problem Relation Age of Onset  . Alcohol abuse Other   . Cancer Other     breast  . Diabetes Other   . Hypertension Other     History   Social History  . Marital Status: Married    Spouse Name: N/A    Number of Children: N/A  . Years of Education: N/A   Social History Main Topics  . Smoking status: Never Smoker   . Smokeless tobacco: None  . Alcohol Use: None  . Drug Use: None  . Sexually Active: None   Other Topics Concern  . None   Social History Narrative  . None    Current outpatient prescriptions:amLODipine (NORVASC) 5 MG tablet, Take 1 tablet (5 mg total) by mouth daily., Disp: 30 tablet, Rfl: 11;  hydrochlorothiazide (HYDRODIURIL) 25 MG tablet, Take 1 tablet (25 mg total) by mouth daily., Disp: 15 tablet, Rfl: 11;  hydrochlorothiazide (HYDRODIURIL) 25 MG tablet, TAKE 1/2 TABLET DAILY, Disp: 45 tablet, Rfl: 0 quinapril (ACCUPRIL) 40 MG tablet, Take 1 tablet (40 mg total) by mouth daily., Disp: 30 tablet, Rfl: 11;  quinapril (ACCUPRIL) 40 MG tablet, TAKE 1 TABLET AT BEDTIME, Disp: 90 tablet, Rfl: 0;  triamcinolone cream (KENALOG) 0.1 %, Apply topically 2 (two) times daily., Disp: 30 g, Rfl: 0  EXAM:  Filed Vitals:   03/22/12 1610  BP: 130/82  Pulse: 78  Temp: 98 F (36.7 C)    Body mass index is 31.85 kg/(m^2).  GENERAL: vitals reviewed and listed above, alert, oriented, appears well hydrated and  in no acute distress  HEENT: atraumatic, conjunttiva clear, no obvious abnormalities on inspection of external nose and ears  NECK: no obvious masses on inspection  SKIN: scattered oval scaly macules and patches on truck - most are macular but a few have excoriation and mild plaque  MS: moves all extremities without noticeable abnormality  PSYCH: pleasant and cooperative, no obvious depression or anxiety  ASSESSMENT AND PLAN:  Discussed the following assessment and plan:  Skin rash - Plan: triamcinolone cream (KENALOG) 0.1 %  -most likely PR with some irritative inflammatory changes in some lesions from scratching - possible numular eczema/guttate psoriasis but most lesions appear more like PR -abx ointment for few irritated lesions for 1 week, plus med pt steroid cream for itching -return precautions -Patient advised to return or notify a doctor immediately if symptoms worsen or persist or new concerns arise.  Patient Instructions  Pityriasis Rosea Pityriasis rosea is a rash which is probably caused by a virus. It generally starts as a scaly, red patch on the trunk (the area of the body that a t-shirt would cover) but does not appear on sun exposed areas. The rash is usually preceded by an initial larger spot called the "herald patch" a week or more before the rest of the rash appears. Generally within one to  two days the rash appears rapidly on the trunk, upper arms, and sometimes the upper legs. The rash usually appears as flat, oval patches of scaly pink color. The rash can also be raised and one is able to feel it with a finger. The rash can also be finely crinkled and may slough off leaving a ring of scale around the spot. Sometimes a mild sore throat is present with the rash. It usually affects children and young adults in the spring and autumn. Women are more frequently affected than men. TREATMENT  Pityriasis rosea is a self-limited condition. This means it goes away within 4 to 8  weeks without treatment. The spots may persist for several months, especially in darker-colored skin after the rash has resolved and healed. Benadryl and steroid creams may be used if itching is a problem. SEEK MEDICAL CARE IF:   Your rash does not go away or persists longer than three months.  You develop fever and joint pain.  You develop severe headache and confusion.  You develop breathing difficulty, vomiting and/or extreme weakness. Document Released: 02/01/2001 Document Revised: 03/20/2011 Document Reviewed: 02/21/2008 [facility] Patient Information 2013 [place], [place], [place].

## 2012-04-14 ENCOUNTER — Other Ambulatory Visit: Payer: Self-pay | Admitting: Family Medicine

## 2012-04-22 ENCOUNTER — Other Ambulatory Visit: Payer: Self-pay | Admitting: Family Medicine

## 2012-05-20 ENCOUNTER — Other Ambulatory Visit: Payer: Self-pay | Admitting: Family Medicine

## 2012-05-21 ENCOUNTER — Other Ambulatory Visit: Payer: Self-pay | Admitting: *Deleted

## 2012-05-21 MED ORDER — QUINAPRIL HCL 40 MG PO TABS: ORAL_TABLET | ORAL | Status: DC

## 2012-06-20 ENCOUNTER — Other Ambulatory Visit: Payer: Self-pay | Admitting: Family Medicine

## 2012-08-18 ENCOUNTER — Other Ambulatory Visit: Payer: Self-pay | Admitting: Family Medicine

## 2012-10-14 ENCOUNTER — Other Ambulatory Visit: Payer: Self-pay | Admitting: Family Medicine

## 2013-02-03 ENCOUNTER — Telehealth: Payer: Self-pay | Admitting: Family Medicine

## 2013-02-03 MED ORDER — QUINAPRIL HCL 40 MG PO TABS: 40.0000 mg | ORAL_TABLET | Freq: Every day | ORAL | Status: DC

## 2013-02-03 NOTE — Telephone Encounter (Signed)
Rx sent to pharmacy   

## 2013-02-03 NOTE — Telephone Encounter (Signed)
Pt will run of medication before his cpx appt on 02/11/13 .  Please send rx refill for quinapril (ACCUPRIL) 40 MG tablet to [facility].

## 2013-02-04 ENCOUNTER — Other Ambulatory Visit (INDEPENDENT_AMBULATORY_CARE_PROVIDER_SITE_OTHER): Payer: 59

## 2013-02-04 DIAGNOSIS — Z0000 Encounter for general adult medical examination without abnormal findings: Secondary | ICD-10-CM

## 2013-02-04 LAB — POCT URINALYSIS DIPSTICK
BILIRUBIN UA: NEGATIVE
Blood, UA: NEGATIVE
Ketones, UA: NEGATIVE
Leukocytes, UA: NEGATIVE
NITRITE UA: NEGATIVE
PH UA: 6
Spec Grav, UA: 1.015
Urobilinogen, UA: 0.2

## 2013-02-04 LAB — CBC WITH DIFFERENTIAL/PLATELET
BASOS PCT: 0.5 % (ref 0.0–3.0)
Basophils Absolute: 0 10*3/uL (ref 0.0–0.1)
EOS ABS: 0.1 10*3/uL (ref 0.0–0.7)
Eosinophils Relative: 1.4 % (ref 0.0–5.0)
HCT: 46.9 % (ref 39.0–52.0)
Hemoglobin: 16 g/dL (ref 13.0–17.0)
Lymphocytes Relative: 34.9 % (ref 12.0–46.0)
Lymphs Abs: 2.9 10*3/uL (ref 0.7–4.0)
MCHC: 34 g/dL (ref 30.0–36.0)
MCV: 86.2 fl (ref 78.0–100.0)
Monocytes Absolute: 0.6 10*3/uL (ref 0.1–1.0)
Monocytes Relative: 7.7 % (ref 3.0–12.0)
NEUTROS ABS: 4.6 10*3/uL (ref 1.4–7.7)
Neutrophils Relative %: 55.5 % (ref 43.0–77.0)
Platelets: 333 10*3/uL (ref 150.0–400.0)
RBC: 5.44 Mil/uL (ref 4.22–5.81)
RDW: 13.2 % (ref 11.5–14.6)
WBC: 8.4 10*3/uL (ref 4.5–10.5)

## 2013-02-04 LAB — LIPID PANEL
CHOL/HDL RATIO: 5
Cholesterol: 139 mg/dL (ref 0–200)
HDL: 28 mg/dL — AB (ref >39.00)
LDL CALC: 94 mg/dL (ref 0–99)
Triglycerides: 85 mg/dL (ref 0.0–149.0)
VLDL: 17 mg/dL (ref 0.0–40.0)

## 2013-02-04 LAB — HEPATIC FUNCTION PANEL
ALBUMIN: 3.9 g/dL (ref 3.5–5.2)
ALT: 35 U/L (ref 0–53)
AST: 21 U/L (ref 0–37)
Alkaline Phosphatase: 90 U/L (ref 39–117)
Bilirubin, Direct: 0.1 mg/dL (ref 0.0–0.3)
TOTAL PROTEIN: 7 g/dL (ref 6.0–8.3)
Total Bilirubin: 0.9 mg/dL (ref 0.3–1.2)

## 2013-02-04 LAB — BASIC METABOLIC PANEL
BUN: 11 mg/dL (ref 6–23)
CALCIUM: 9.2 mg/dL (ref 8.4–10.5)
CHLORIDE: 104 meq/L (ref 96–112)
CO2: 27 meq/L (ref 19–32)
Creatinine, Ser: 0.9 mg/dL (ref 0.4–1.5)
GFR: 91.73 mL/min (ref >60.00)
Glucose, Bld: 128 mg/dL — ABNORMAL HIGH (ref 70–99)
Potassium: 4.4 mEq/L (ref 3.5–5.1)
Sodium: 138 mEq/L (ref 135–145)

## 2013-02-04 LAB — TSH: TSH: 0.38 u[IU]/mL (ref 0.35–5.50)

## 2013-02-04 LAB — PSA: PSA: 0.15 ng/mL (ref 0.10–4.00)

## 2013-02-04 LAB — HEMOGLOBIN A1C: HEMOGLOBIN A1C: 7.7 % — AB (ref 4.6–6.5)

## 2013-02-04 NOTE — Addendum Note (Signed)
Addended by: Bonnye FavaKWEI, Aja Bolander K on: 02/04/2013 10:18 AM   Modules accepted: Orders

## 2013-02-11 ENCOUNTER — Ambulatory Visit (INDEPENDENT_AMBULATORY_CARE_PROVIDER_SITE_OTHER): Payer: 59 | Admitting: Family Medicine

## 2013-02-11 ENCOUNTER — Encounter: Payer: Self-pay | Admitting: Family Medicine

## 2013-02-11 VITALS — BP 130/70 | HR 75 | Temp 97.9°F | Ht 70.0 in | Wt 223.0 lb

## 2013-02-11 DIAGNOSIS — Z23 Encounter for immunization: Secondary | ICD-10-CM

## 2013-02-11 DIAGNOSIS — E669 Obesity, unspecified: Secondary | ICD-10-CM | POA: Insufficient documentation

## 2013-02-11 DIAGNOSIS — Z0000 Encounter for general adult medical examination without abnormal findings: Secondary | ICD-10-CM

## 2013-02-11 DIAGNOSIS — I10 Essential (primary) hypertension: Secondary | ICD-10-CM

## 2013-02-11 DIAGNOSIS — E119 Type 2 diabetes mellitus without complications: Secondary | ICD-10-CM

## 2013-02-11 MED ORDER — HYDROCHLOROTHIAZIDE 25 MG PO TABS: 25.0000 mg | ORAL_TABLET | Freq: Every day | ORAL | Status: DC

## 2013-02-11 MED ORDER — AMLODIPINE BESYLATE 5 MG PO TABS: 5.0000 mg | ORAL_TABLET | Freq: Every day | ORAL | Status: DC

## 2013-02-11 MED ORDER — QUINAPRIL HCL 40 MG PO TABS: 40.0000 mg | ORAL_TABLET | Freq: Every day | ORAL | Status: DC

## 2013-02-11 MED ORDER — SILDENAFIL CITRATE 100 MG PO TABS: 50.0000 mg | ORAL_TABLET | Freq: Every day | ORAL | Status: DC | PRN

## 2013-02-11 NOTE — Assessment & Plan Note (Signed)
Controlled with current medications.  Recommended weight loss.

## 2013-02-11 NOTE — Patient Instructions (Addendum)
Hyperglycemia Hyperglycemia occurs when the glucose (sugar) in your blood is too high. Hyperglycemia can happen for many reasons, but it most often happens to people who do not know they have diabetes or are not managing their diabetes properly.  CAUSES  Whether you have diabetes or not, there are other causes of hyperglycemia. Hyperglycemia can occur when you have diabetes, but it can also occur in other situations that you might not be as aware of, such as: Diabetes  If you have diabetes and are having problems controlling your blood glucose, hyperglycemia could occur because of some of the following reasons:  Not following your meal plan.  Not taking your diabetes medications or not taking it properly.  Exercising less or doing less activity than you normally do.  Being sick. Pre-diabetes  This cannot be ignored. Before people develop Type 2 diabetes, they almost always have "pre-diabetes." This is when your blood glucose levels are higher than normal, but not yet high enough to be diagnosed as diabetes. Research has shown that some long-term damage to the body, especially the heart and circulatory system, may already be occurring during pre-diabetes. If you take action to manage your blood glucose when you have pre-diabetes, you may delay or prevent Type 2 diabetes from developing. Stress  If you have diabetes, you may be "diet" controlled or on oral medications or insulin to control your diabetes. However, you may find that your blood glucose is higher than usual in the hospital whether you have diabetes or not. This is often referred to as "stress hyperglycemia." Stress can elevate your blood glucose. This happens because of hormones put out by the body during times of stress. If stress has been the cause of your high blood glucose, it can be followed regularly by your caregiver. That way he/she can make sure your hyperglycemia does not continue to get worse or progress to  diabetes. Steroids  Steroids are medications that act on the infection fighting system (immune system) to block inflammation or infection. One side effect can be a rise in blood glucose. Most people can produce enough extra insulin to allow for this rise, but for those who cannot, steroids make blood glucose levels go even higher. It is not unusual for steroid treatments to "uncover" diabetes that is developing. It is not always possible to determine if the hyperglycemia will go away after the steroids are stopped. A special blood test called an A1c is sometimes done to determine if your blood glucose was elevated before the steroids were started. SYMPTOMS  Thirsty.  Frequent urination.  Dry mouth.  Blurred vision.  Tired or fatigue.  Weakness.  Sleepy.  Tingling in feet or leg. DIAGNOSIS  Diagnosis is made by monitoring blood glucose in one or all of the following ways:  A1c test. This is a chemical found in your blood.  Fingerstick blood glucose monitoring.  Laboratory results. TREATMENT  First, knowing the cause of the hyperglycemia is important before the hyperglycemia can be treated. Treatment may include, but is not be limited to:  Education.  Change or adjustment in medications.  Change or adjustment in meal plan.  Treatment for an illness, infection, etc.  More frequent blood glucose monitoring.  Change in exercise plan.  Decreasing or stopping steroids.  Lifestyle changes. HOME CARE INSTRUCTIONS   Test your blood glucose as directed.  Exercise regularly. Your caregiver will give you instructions about exercise. Pre-diabetes or diabetes which comes on with stress is helped by exercising.  Eat wholesome,   balanced meals. Eat often and at regular, fixed times. Your caregiver or nutritionist will give you a meal plan to guide your sugar intake.  Being at an ideal weight is important. If needed, losing as little as 10 to 15 pounds may help improve blood  glucose levels. SEEK MEDICAL CARE IF:   You have questions about medicine, activity, or diet.  You continue to have symptoms (problems such as increased thirst, urination, or weight gain). SEEK IMMEDIATE MEDICAL CARE IF:   You are vomiting or have diarrhea.  Your breath smells fruity.  You are breathing faster or slower.  You are very sleepy or incoherent.  You have numbness, tingling, or pain in your feet or hands.  You have chest pain.  Your symptoms get worse even though you have been following your caregiver's orders.  If you have any other questions or concerns. Document Released: 06/21/2000 Document Revised: 03/20/2011 Document Reviewed: 04/24/2011 [facility] Patient Information 2014 [place], [place]. Basic Carbohydrate Counting Basic carbohydrate counting is a way to plan meals. It is done by counting the amount of carbohydrate in foods. Foods that have carbohydrates are starches (grains, beans, starchy vegetables) and sweets. Eating carbohydrates increases blood glucose (sugar) levels. People with diabetes use carbohydrate counting to help keep their blood glucose at a normal level.  COUNTING CARBOHYDRATES IN FOODS The first step in counting carbohydrates is to learn how many carbohydrate servings you should have in every meal. A dietitian can plan this for you. After learning the amount of carbohydrates to include in your meal plan, you can start to choose the carbohydrate-containing foods you want to eat.  There are 2 ways to identify the amount of carbohydrates in the foods you eat. Read the Nutrition Facts panel on food labels. You need 2 pieces of information from the Nutrition Facts panel to count carbohydrates this way: Serving size. Total carbohydrate (in grams). Decide how many servings you will be eating. If it is 1 serving, you will be eating the amount of carbohydrate listed on the panel. If you will be eating 2 servings, you will be eating double the amount of  carbohydrate listed on the panel.  Learn serving sizes. A serving size of most carbohydrate-containing foods is about 15 grams (g). Listed below are single serving sizes of common carbohydrate-containing foods: 1 slice bread.  cup unsweetened, dry cereal.  cup hot cereal.  cup rice.  cup mashed potatoes.  cup pasta. 1 cup fresh fruit.  cup canned fruit. 1 cup milk (whole, 2%, or skim).  cup starchy vegetables (peas, corn, or potatoes). Counting carbohydrates this way is similar to looking on the Nutrition Facts panel. Decide how many servings you will eat first. Multiply the number of servings you eat by 15 g. For example, if you have 2 cups of strawberries, you had 2 servings. That means you had 30 g of carbohydrate (2 servings x 15 g = 30 g). CALCULATING CARBOHYDRATES IN A MEAL Sample dinner 3 oz chicken breast.  cup brown rice.  cup corn. 1 cup fat-free milk. 1 cup strawberries with sugar-free whipped topping. Carbohydrate calculation First, identify the foods that contain carbohydrate: Rice. Corn. Milk. Strawberries. Calculate the number of servings eaten: 2 servings rice. 1 serving corn. 1 serving milk. 1 serving strawberries. Multiply the number of servings by 15 g: 2 servings rice x 15 g = 30 g. 1 serving corn x 15 g = 15 g. 1 serving milk x 15 g = 15 g. 1 serving strawberries x  15 g = 15 g. Add the amounts to find the total carbohydrates eaten: 30 g + 15 g + 15 g + 15 g = 75 g carbohydrate eaten at dinner. Document Released: 12/26/2004 Document Revised: 03/20/2011 Document Reviewed: 11/11/2010 [facility] Patient Information 2014 [place], [place].

## 2013-02-11 NOTE — Progress Notes (Signed)
Pre visit review using our clinic review tool, if applicable. No additional management support is needed unless otherwise documented below in the visit note. 

## 2013-02-11 NOTE — Progress Notes (Signed)
Subjective:    Patient ID: Peter Hughes, male    DOB: 20-Sep-1955, 58 y.o.   MRN: [id number]  HPI Patient here for complete physical. He has history of hypertension treated with 3 drug regimen.  Blood pressure stable. He does not exercise. Weight has been relatively stable. He has refused further colonoscopies. Last tetanus was 10 years ago. Denies any recent symptoms of polydipsia or polyuria.  Does have hx of prediabetes.  Lab on 02/04/2013  Component Date Value Range Status  . Sodium 02/04/2013 138  135 - 145 mEq/L Final  . Potassium 02/04/2013 4.4  3.5 - 5.1 mEq/L Final  . Chloride 02/04/2013 104  96 - 112 mEq/L Final  . CO2 02/04/2013 27  19 - 32 mEq/L Final  . Glucose, Bld 02/04/2013 128* 70 - 99 mg/dL Final  . BUN 40/98/1191 11  6 - 23 mg/dL Final  . Creatinine, Ser 02/04/2013 0.9  0.4 - 1.5 mg/dL Final  . Calcium 47/82/9562 9.2  8.4 - 10.5 mg/dL Final  . GFR 13/08/6576 91.73  >60.00 mL/min Final  . WBC 02/04/2013 8.4  4.5 - 10.5 K/uL Final  . RBC 02/04/2013 5.44  4.22 - 5.81 Mil/uL Final  . Hemoglobin 02/04/2013 16.0  13.0 - 17.0 g/dL Final  . HCT 46/96/2952 46.9  39.0 - 52.0 % Final  . MCV 02/04/2013 86.2  78.0 - 100.0 fl Final  . MCHC 02/04/2013 34.0  30.0 - 36.0 g/dL Final  . RDW 84/13/2440 13.2  11.5 - 14.6 % Final  . Platelets 02/04/2013 333.0  150.0 - 400.0 K/uL Final  . Neutrophils Relative % 02/04/2013 55.5  43.0 - 77.0 % Final  . Lymphocytes Relative 02/04/2013 34.9  12.0 - 46.0 % Final  . Monocytes Relative 02/04/2013 7.7  3.0 - 12.0 % Final  . Eosinophils Relative 02/04/2013 1.4  0.0 - 5.0 % Final  . Basophils Relative 02/04/2013 0.5  0.0 - 3.0 % Final  . Neutro Abs 02/04/2013 4.6  1.4 - 7.7 K/uL Final  . Lymphs Abs 02/04/2013 2.9  0.7 - 4.0 K/uL Final  . Monocytes Absolute 02/04/2013 0.6  0.1 - 1.0 K/uL Final  . Eosinophils Absolute 02/04/2013 0.1  0.0 - 0.7 K/uL Final  . Basophils Absolute 02/04/2013 0.0  0.0 - 0.1 K/uL Final  . Cholesterol 02/04/2013  139  0 - 200 mg/dL Final   ATP III Classification       Desirable:  < 200 mg/dL               Borderline High:  200 - 239 mg/dL          High:  > = 102 mg/dL  . Triglycerides 02/04/2013 85.0  0.0 - 149.0 mg/dL Final   Normal:  <725 mg/dLBorderline High:  150 - 199 mg/dL  . HDL 02/04/2013 28.00* >39.00 mg/dL Final  . VLDL 36/64/4034 17.0  0.0 - 40.0 mg/dL Final  . LDL Cholesterol 02/04/2013 94  0 - 99 mg/dL Final  . Total CHOL/HDL Ratio 02/04/2013 5   Final                  Men          Women1/2 Average Risk     3.4          3.3Average Risk          5.0          4.42X Average Risk          9.6  7.13X Average Risk          15.0          11.0                      . Color, UA 02/04/2013 yellow   Final  . Clarity, UA 02/04/2013 clear   Final  . Glucose, UA 02/04/2013 trace   Final  . Bilirubin, UA 02/04/2013 n   Final  . Ketones, UA 02/04/2013 n   Final  . Spec Grav, UA 02/04/2013 1.015   Final  . Blood, UA 02/04/2013 n   Final  . pH, UA 02/04/2013 6.0   Final  . Protein, UA 02/04/2013 1+   Final  . Urobilinogen, UA 02/04/2013 0.2   Final  . Nitrite, UA 02/04/2013 n   Final  . Leukocytes, UA 02/04/2013 Negative   Final  . TSH 02/04/2013 0.38  0.35 - 5.50 uIU/mL Final  . Total Bilirubin 02/04/2013 0.9  0.3 - 1.2 mg/dL Final  . Bilirubin, Direct 02/04/2013 0.1  0.0 - 0.3 mg/dL Final  . Alkaline Phosphatase 02/04/2013 90  39 - 117 U/L Final  . AST 02/04/2013 21  0 - 37 U/L Final  . ALT 02/04/2013 35  0 - 53 U/L Final  . Total Protein 02/04/2013 7.0  6.0 - 8.3 g/dL Final  . Albumin 16/10/9602 3.9  3.5 - 5.2 g/dL Final  . PSA 54/09/8117 0.15  0.10 - 4.00 ng/mL Final  . Hemoglobin A1C 02/04/2013 7.7* 4.6 - 6.5 % Final   Glycemic Control Guidelines for People with Diabetes:Non Diabetic:  <6%Goal of Therapy: <7%Additional Action Suggested:  >8%      Past Medical History  Diagnosis Date  . HYPERTENSION 08/13/2008  . EPIDIDYMITIS 08/03/2009   Past Surgical History  Procedure Laterality  Date  . Lipotripsy  2006    Kidney stone  . Hand surgery  2005    crush injury    reports that he has never smoked. He does not have any smokeless tobacco history on file. His alcohol and drug histories are not on file. family history includes Alcohol abuse in his other; Cancer in his other; Diabetes in his other; Hypertension in his other. Allergies  Allergen Reactions  . Benadryl [Diphenhydramine Hcl]     agitation  . Diphenhydramine Hcl     Reaction: agitation, a cough green syrup      Review of Systems  Constitutional: Negative for fever, activity change, appetite change and fatigue.  HENT: Negative for congestion, ear pain and trouble swallowing.   Eyes: Negative for pain and visual disturbance.  Respiratory: Negative for cough, shortness of breath and wheezing.   Cardiovascular: Negative for chest pain and palpitations.  Gastrointestinal: Negative for nausea, vomiting, abdominal pain, diarrhea, constipation, blood in stool, abdominal distention and rectal pain.  Genitourinary: Negative for dysuria, hematuria and testicular pain.  Musculoskeletal: Negative for arthralgias and joint swelling.  Skin: Negative for rash.  Neurological: Negative for dizziness, syncope and headaches.  Hematological: Negative for adenopathy.  Psychiatric/Behavioral: Negative for confusion and dysphoric mood.       Objective:   Physical Exam  Constitutional: He is oriented to person, place, and time. He appears well-developed and well-nourished. No distress.  HENT:  Head: Normocephalic and atraumatic.  Right Ear: External ear normal.  Left Ear: External ear normal.  Mouth/Throat: Oropharynx is clear and moist.  Eyes: Conjunctivae and EOM are normal. Pupils are equal, round, and reactive to light.  Neck:  Normal range of motion. Neck supple. No thyromegaly present.  Cardiovascular: Normal rate, regular rhythm and normal heart sounds.   No murmur heard. Pulmonary/Chest: No respiratory  distress. He has no wheezes. He has no rales.  Abdominal: Soft. Bowel sounds are normal. He exhibits no distension and no mass. There is no tenderness. There is no rebound and no guarding.  Musculoskeletal: He exhibits no edema.  Lymphadenopathy:    He has no cervical adenopathy.  Neurological: He is alert and oriented to person, place, and time. He displays normal reflexes. No cranial nerve deficit.  Skin: No rash noted.  Psychiatric: He has a normal mood and affect.          Assessment & Plan:  Complete physical. Labs reviewed. He has trace glucose on urine dipstick with fasting blood sugar 128 and hemoglobin A1c of 7.7%. Likely has type 2 diabetes. We discussed this at some length. We discussed lifestyle modification. He currently has very high starch/carbohydrate diet. He prefers weight loss and lifestyle management versus medication this time. Reassess in 3 months repeat A1c and fasting blood sugar then. We offered diabetic education but he defers. Tetanus booster given. Patient refuses further colonoscopiesBody mass index is 32 kg/(m^2).

## 2013-02-12 ENCOUNTER — Telehealth: Payer: Self-pay | Admitting: Family Medicine

## 2013-02-12 NOTE — Telephone Encounter (Signed)
Relevant patient education mailed to patient.  

## 2013-05-12 ENCOUNTER — Ambulatory Visit (INDEPENDENT_AMBULATORY_CARE_PROVIDER_SITE_OTHER): Payer: 59 | Admitting: Family Medicine

## 2013-05-12 VITALS — BP 128/70 | HR 67 | Wt 194.0 lb

## 2013-05-12 DIAGNOSIS — I10 Essential (primary) hypertension: Secondary | ICD-10-CM

## 2013-05-12 DIAGNOSIS — L300 Nummular dermatitis: Secondary | ICD-10-CM

## 2013-05-12 DIAGNOSIS — E119 Type 2 diabetes mellitus without complications: Secondary | ICD-10-CM

## 2013-05-12 DIAGNOSIS — L259 Unspecified contact dermatitis, unspecified cause: Secondary | ICD-10-CM

## 2013-05-12 LAB — BASIC METABOLIC PANEL
BUN: 13 mg/dL (ref 6–23)
CO2: 28 meq/L (ref 19–32)
Calcium: 9.6 mg/dL (ref 8.4–10.5)
Chloride: 103 mEq/L (ref 96–112)
Creatinine, Ser: 1 mg/dL (ref 0.4–1.5)
GFR: 80.34 mL/min (ref >60.00)
Glucose, Bld: 88 mg/dL (ref 70–99)
POTASSIUM: 4.1 meq/L (ref 3.5–5.1)
SODIUM: 139 meq/L (ref 135–145)

## 2013-05-12 LAB — HEMOGLOBIN A1C: Hgb A1c MFr Bld: 5.7 % (ref 4.6–6.5)

## 2013-05-12 NOTE — Progress Notes (Signed)
Pre visit review using our clinic review tool, if applicable. No additional management support is needed unless otherwise documented below in the visit note. 

## 2013-05-12 NOTE — Progress Notes (Signed)
   Subjective:    Patient ID: Peter Hughes, male    DOB: 1955-06-11, 58 y.o.   MRN: [id number]  HPI Patient seen for medical followup. He was seen for physical back in February. His weight was 223 pounds and. A1c of 7.7% and fasting blood sugar 128. Prior history of prediabetes. We discussed possible initiation of metformin. He preferred to work on weight loss and lifestyle modification. He still not exercising regularly but is quite symptomatic lifestyle changes. He's lost almost 30 pounds since last visit. Still has some fatigue issues. Denies any symptoms of polyuria or polydipsia. He has hypertension treated with 3 drug regimen. He is not monitoring blood sugars.  Recurrent scaly rash which occurred over year ago. He has used triamcinolone cream off and on. Rashes trunk area mostly lower waist region. When he was seen initially for this was called he had pityriasis rosea.  Past Medical History  Diagnosis Date  . HYPERTENSION 08/13/2008  . EPIDIDYMITIS 08/03/2009   Past Surgical History  Procedure Laterality Date  . Lipotripsy  2006    Kidney stone  . Hand surgery  2005    crush injury    reports that he has never smoked. He does not have any smokeless tobacco history on file. His alcohol and drug histories are not on file. family history includes Alcohol abuse in his other; Cancer in his other; Diabetes in his other; Hypertension in his other. Allergies  Allergen Reactions  . Benadryl [Diphenhydramine Hcl]     agitation  . Diphenhydramine Hcl     Reaction: agitation, a cough green syrup      Review of Systems  Constitutional: Negative for fever, chills and fatigue.  Eyes: Negative for visual disturbance.  Respiratory: Negative for cough, chest tightness and shortness of breath.   Cardiovascular: Negative for chest pain, palpitations and leg swelling.  Skin: Positive for rash.  Neurological: Negative for dizziness, syncope, weakness, light-headedness and headaches.         Objective:   Physical Exam  Constitutional: He appears well-developed and well-nourished.  Cardiovascular: Normal rate and regular rhythm.   Pulmonary/Chest: Effort normal and breath sounds normal. No respiratory distress. He has no wheezes. He has no rales.  Musculoskeletal: He exhibits no edema.  Skin:  Patient has a couple of areas of rash lower back and lower abdominal region. These have somewhat of a nummular shape and erythematous base with scaly surface.          Assessment & Plan:  #1 type 2 diabetes. Recently diagnosed. Patient has done an excellent job with weight loss and lifestyle modification. Recheck A1c. Recheck fasting blood sugar. We discussed adding more exercise to help with maintenance of weight loss. Routine followup 6 months #2 hypertension which is stable and at goal. Continue current medications #3 skin rash. Suspect nummular eczema. Continue triamcinolone cream as needed

## 2013-05-20 ENCOUNTER — Telehealth: Payer: Self-pay

## 2013-05-20 NOTE — Telephone Encounter (Signed)
Relevant patient education mailed to patient.  

## 2013-06-30 ENCOUNTER — Other Ambulatory Visit: Payer: Self-pay | Admitting: Family Medicine

## 2013-07-31 ENCOUNTER — Other Ambulatory Visit: Payer: Self-pay | Admitting: Family Medicine

## 2013-11-12 ENCOUNTER — Ambulatory Visit (INDEPENDENT_AMBULATORY_CARE_PROVIDER_SITE_OTHER): Payer: 59 | Admitting: Family Medicine

## 2013-11-12 ENCOUNTER — Encounter: Payer: Self-pay | Admitting: Family Medicine

## 2013-11-12 VITALS — BP 126/70 | HR 69 | Temp 97.8°F | Wt 186.0 lb

## 2013-11-12 DIAGNOSIS — E119 Type 2 diabetes mellitus without complications: Secondary | ICD-10-CM

## 2013-11-12 DIAGNOSIS — I10 Essential (primary) hypertension: Secondary | ICD-10-CM

## 2013-11-12 LAB — HEMOGLOBIN A1C: Hgb A1c MFr Bld: 5.7 % (ref 4.6–6.5)

## 2013-11-12 NOTE — Progress Notes (Signed)
   Subjective:    Patient ID: Peter Hughes, male    DOB: 05-11-1955, 58 y.o.   MRN: [id number]  HPI Patient here for medical follow-up  Hypertension treated with quinapril, amlodipine, HCTZ. He did tremendous job with weight loss last year and is maintaining that. Blood pressures been very stable. He does not monitor regularly. No dizziness. No chest pains. No edema issues. Compliant with medications.  History of type 2 diabetes. Tremendous lifestyle changes and A1c went from 7.7% of 5.7% with lifestyle modification alone. He does not monitor blood sugars. No polyuria or polydipsia.  He declines flu vaccine.  Reviewed with no changes:  Past Medical History  Diagnosis Date  . HYPERTENSION 08/13/2008  . EPIDIDYMITIS 08/03/2009   Past Surgical History  Procedure Laterality Date  . Lipotripsy  2006    Kidney stone  . Hand surgery  2005    crush injury    reports that he has never smoked. He does not have any smokeless tobacco history on file. His alcohol and drug histories are not on file. family history includes Alcohol abuse in his other; Cancer in his other; Diabetes in his other; Hypertension in his other. Allergies  Allergen Reactions  . Benadryl [Diphenhydramine Hcl]     agitation  . Diphenhydramine Hcl     Reaction: agitation, a cough green syrup      Review of Systems  Constitutional: Negative for fatigue.  Eyes: Negative for visual disturbance.  Respiratory: Negative for cough, chest tightness and shortness of breath.   Cardiovascular: Negative for chest pain, palpitations and leg swelling.  Endocrine: Negative for polydipsia and polyuria.  Neurological: Negative for dizziness, syncope, weakness, light-headedness and headaches.       Objective:   Physical Exam  Constitutional: He is oriented to person, place, and time. He appears well-developed and well-nourished.  HENT:  Right Ear: External ear normal.  Left Ear: External ear normal.  Mouth/Throat:  Oropharynx is clear and moist.  Eyes: Pupils are equal, round, and reactive to light.  Neck: Neck supple. No thyromegaly present.  Cardiovascular: Normal rate and regular rhythm.   Pulmonary/Chest: Effort normal and breath sounds normal. No respiratory distress. He has no wheezes. He has no rales.  Musculoskeletal: He exhibits no edema.  Neurological: He is alert and oriented to person, place, and time.          Assessment & Plan:  #1 hypertension. Well controlled. Continue current medications #2 type 2 diabetes well controlled with lifestyle modification. Recheck A1c. If normal today consider going to once yearly #3 health maintenance. Flu vaccine encouraged and patient declines. Consider complete physical in 6 months

## 2013-11-12 NOTE — Progress Notes (Signed)
Pre visit review using our clinic review tool, if applicable. No additional management support is needed unless otherwise documented below in the visit note. 

## 2013-11-27 ENCOUNTER — Other Ambulatory Visit: Payer: Self-pay | Admitting: Family Medicine

## 2014-02-23 ENCOUNTER — Other Ambulatory Visit: Payer: Self-pay | Admitting: Family Medicine

## 2014-03-23 ENCOUNTER — Other Ambulatory Visit: Payer: Self-pay | Admitting: Family Medicine

## 2014-05-07 ENCOUNTER — Encounter: Payer: Self-pay | Admitting: Family Medicine

## 2014-05-07 ENCOUNTER — Ambulatory Visit (INDEPENDENT_AMBULATORY_CARE_PROVIDER_SITE_OTHER): Payer: 59 | Admitting: Family Medicine

## 2014-05-07 VITALS — BP 124/74 | HR 72 | Temp 97.8°F | Wt 188.0 lb

## 2014-05-07 DIAGNOSIS — L42 Pityriasis rosea: Secondary | ICD-10-CM

## 2014-05-07 MED ORDER — TRIAMCINOLONE ACETONIDE 0.1 % EX CREA: 1.0000 "application " | TOPICAL_CREAM | Freq: Two times a day (BID) | CUTANEOUS | Status: DC | PRN

## 2014-05-07 NOTE — Patient Instructions (Signed)
Pityriasis Rosea Pityriasis rosea is a rash which is probably caused by a virus. It generally starts as a scaly, red patch on the trunk (the area of the body that a t-shirt would cover) but does not appear on sun exposed areas. The rash is usually preceded by an initial larger spot called the "herald patch" a week or more before the rest of the rash appears. Generally within one to two days the rash appears rapidly on the trunk, upper arms, and sometimes the upper legs. The rash usually appears as flat, oval patches of scaly pink color. The rash can also be raised and one is able to feel it with a finger. The rash can also be finely crinkled and may slough off leaving a ring of scale around the spot. Sometimes a mild sore throat is present with the rash. It usually affects children and young adults in the spring and autumn. Women are more frequently affected than men. TREATMENT  Pityriasis rosea is a self-limited condition. This means it goes away within 4 to 8 weeks without treatment. The spots may persist for several months, especially in darker-colored skin after the rash has resolved and healed. Benadryl and steroid creams may be used if itching is a problem. SEEK MEDICAL CARE IF:   Your rash does not go away or persists longer than three months.  You develop fever and joint pain.  You develop severe headache and confusion.  You develop breathing difficulty, vomiting and/or extreme weakness. Document Released: 02/01/2001 Document Revised: 03/20/2011 Document Reviewed: 02/21/2008 [facility] Patient Information 2015 [place], [place]. This information is not intended to replace advice given to you by your health care provider. Make sure you discuss any questions you have with your health care provider.  We can consider skin biopsy in 4-6 weeks if no fading.

## 2014-05-07 NOTE — Progress Notes (Signed)
   Subjective:    Patient ID: Peter Hughes, male    DOB: 09-11-1955, 59 y.o.   MRN: [id number]  HPI Patient seen with slightly pruritic skin rash which is mostly involving his trunk region.. This started a few months ago. He tried over-the-counter hydrocortisone cream without much improvement. No exacerbating features. Has not had associated fevers or chills. No arthralgias.  Past Medical History  Diagnosis Date  . HYPERTENSION 08/13/2008  . EPIDIDYMITIS 08/03/2009   Past Surgical History  Procedure Laterality Date  . Lipotripsy  2006    Kidney stone  . Hand surgery  2005    crush injury    reports that he has never smoked. He does not have any smokeless tobacco history on file. His alcohol and drug histories are not on file. family history includes Alcohol abuse in his other; Cancer in his other; Diabetes in his other; Hypertension in his other. Allergies  Allergen Reactions  . Benadryl [Diphenhydramine Hcl]     agitation  . Diphenhydramine Hcl     Reaction: agitation, a cough green syrup      Review of Systems  Constitutional: Negative for fever and chills.  Musculoskeletal: Negative for arthralgias.  Skin: Positive for rash.       Objective:   Physical Exam  Constitutional: He appears well-developed and well-nourished.  Cardiovascular: Normal rate and regular rhythm.   Pulmonary/Chest: Effort normal and breath sounds normal. No respiratory distress. He has no wheezes. He has no rales.  Skin: Rash noted.  Scattered rash on his trunk. Erythematous base with scaly surface. Somewhat of a dermatome distribution          Assessment & Plan:  Skin rash. Suspect psoriasis rosea. Triamcinolone 0.1% cream twice daily as needed. He is aware this may take several months to resolve. Consider skin biopsy if not resolving over the next 1-2 months.

## 2014-05-07 NOTE — Progress Notes (Signed)
Pre visit review using our clinic review tool, if applicable. No additional management support is needed unless otherwise documented below in the visit note. 

## 2014-07-21 ENCOUNTER — Other Ambulatory Visit: Payer: Self-pay | Admitting: Family Medicine

## 2014-07-31 ENCOUNTER — Ambulatory Visit (INDEPENDENT_AMBULATORY_CARE_PROVIDER_SITE_OTHER): Payer: 59 | Admitting: Family Medicine

## 2014-07-31 ENCOUNTER — Encounter: Payer: Self-pay | Admitting: Family Medicine

## 2014-07-31 ENCOUNTER — Other Ambulatory Visit: Payer: Self-pay | Admitting: Family Medicine

## 2014-07-31 VITALS — BP 120/72 | HR 78 | Wt 181.0 lb

## 2014-07-31 DIAGNOSIS — IMO0002 Reserved for concepts with insufficient information to code with codable children: Secondary | ICD-10-CM

## 2014-07-31 DIAGNOSIS — R197 Diarrhea, unspecified: Secondary | ICD-10-CM | POA: Diagnosis not present

## 2014-07-31 DIAGNOSIS — R7989 Other specified abnormal findings of blood chemistry: Secondary | ICD-10-CM

## 2014-07-31 DIAGNOSIS — E860 Dehydration: Secondary | ICD-10-CM | POA: Diagnosis not present

## 2014-07-31 DIAGNOSIS — D72829 Elevated white blood cell count, unspecified: Secondary | ICD-10-CM

## 2014-07-31 LAB — CBC WITH DIFFERENTIAL/PLATELET
Basophils Absolute: 0 10*3/uL (ref 0.0–0.1)
Basophils Relative: 0.2 % (ref 0.0–3.0)
Eosinophils Absolute: 0.2 10*3/uL (ref 0.0–0.7)
Eosinophils Relative: 2.4 % (ref 0.0–5.0)
HEMATOCRIT: 52 % (ref 39.0–52.0)
HEMOGLOBIN: 17.4 g/dL — AB (ref 13.0–17.0)
LYMPHS PCT: 21.7 % (ref 12.0–46.0)
Lymphs Abs: 2 10*3/uL (ref 0.7–4.0)
MCHC: 33.5 g/dL (ref 30.0–36.0)
MCV: 87.4 fl (ref 78.0–100.0)
MONO ABS: 1.1 10*3/uL — AB (ref 0.1–1.0)
Monocytes Relative: 12.4 % — ABNORMAL HIGH (ref 3.0–12.0)
Neutro Abs: 5.8 10*3/uL (ref 1.4–7.7)
Neutrophils Relative %: 63.3 % (ref 43.0–77.0)
Platelets: 320 10*3/uL (ref 150.0–400.0)
RBC: 5.95 Mil/uL — AB (ref 4.22–5.81)
RDW: 13.4 % (ref 11.5–15.5)
WBC: 9.1 10*3/uL (ref 4.0–10.5)

## 2014-07-31 LAB — BASIC METABOLIC PANEL
BUN: 38 mg/dL — ABNORMAL HIGH (ref 6–23)
CALCIUM: 9.2 mg/dL (ref 8.4–10.5)
CHLORIDE: 105 meq/L (ref 96–112)
CO2: 22 mEq/L (ref 19–32)
CREATININE: 2.1 mg/dL — AB (ref 0.40–1.50)
GFR: 34.76 mL/min — ABNORMAL LOW (ref >60.00)
Glucose, Bld: 86 mg/dL (ref 70–99)
POTASSIUM: 3.7 meq/L (ref 3.5–5.1)
Sodium: 135 mEq/L (ref 135–145)

## 2014-07-31 NOTE — Progress Notes (Signed)
Pre visit review using our clinic review tool, if applicable. No additional management support is needed unless otherwise documented below in the visit note. 

## 2014-07-31 NOTE — Progress Notes (Signed)
   Subjective:    Patient ID: Peter Hughes, male    DOB: May 01, 1955, 59 y.o.   MRN: [id number]  HPI Patient seen for ER follow-up. He had fairly sudden onset of severe diarrhea last Monday. He has several episodes of nonbloody watery diarrhea with nausea but no vomiting. No fever. He felt extremely weak and started developing diffuse muscle cramps. Went to ER and had CT abdomen pelvis which was basically unremarkable. C. difficile PCR and stool culture negative. Labs significant for creatinine 1.7, BUN 34, white blood count 15.9 thousand. No recent travels. No recent antibiotic use. Colonoscopy 2004 and he has declined further colonoscopies.  Past Medical History  Diagnosis Date  . HYPERTENSION 08/13/2008  . EPIDIDYMITIS 08/03/2009   Past Surgical History  Procedure Laterality Date  . Lipotripsy  2006    Kidney stone  . Hand surgery  2005    crush injury    reports that he has never smoked. He does not have any smokeless tobacco history on file. His alcohol and drug histories are not on file. family history includes Alcohol abuse in his other; Cancer in his other; Diabetes in his other; Hypertension in his other. Allergies  Allergen Reactions  . Benadryl [Diphenhydramine Hcl]     agitation  . Diphenhydramine Hcl     Reaction: agitation, a cough green syrup      Review of Systems  Constitutional: Positive for fatigue. Negative for fever and chills.  Respiratory: Negative for shortness of breath.   Cardiovascular: Negative for chest pain.  Gastrointestinal: Positive for diarrhea. Negative for nausea, vomiting, abdominal pain, constipation and blood in stool.  Genitourinary: Negative for dysuria.  Skin: Negative for rash.  Neurological: Negative for dizziness and syncope.       Objective:   Physical Exam  Constitutional: He appears well-developed and well-nourished.  HENT:  Mouth/Throat: Oropharynx is clear and moist.  Neck: Neck supple.  Cardiovascular: Normal rate and  regular rhythm.   Pulmonary/Chest: Effort normal and breath sounds normal. No respiratory distress. He has no wheezes. He has no rales.  Abdominal: Soft. Bowel sounds are normal. He exhibits no distension. There is no tenderness. There is no rebound and no guarding.  Musculoskeletal: He exhibits no edema.  Neurological: He is alert.  Skin: No rash noted.          Assessment & Plan:  Severe diarrhea. Patient had definite dehydration on presentation ER early in the week. He is keeping down some fluids and diarrhea slowed down but not ceased. He had significant elevated white count. Recheck CBC and basic metabolic panel. Handout on diarrhea given. Discussed dietary factors. Continue Lomotil but taper off quickly as his diarrhea ceases. Touch base by next week if diarrhea not improving

## 2014-07-31 NOTE — Patient Instructions (Signed)

## 2014-08-04 ENCOUNTER — Other Ambulatory Visit (INDEPENDENT_AMBULATORY_CARE_PROVIDER_SITE_OTHER): Payer: 59

## 2014-08-04 DIAGNOSIS — R748 Abnormal levels of other serum enzymes: Secondary | ICD-10-CM | POA: Diagnosis not present

## 2014-08-04 DIAGNOSIS — R7989 Other specified abnormal findings of blood chemistry: Secondary | ICD-10-CM

## 2014-08-04 LAB — BASIC METABOLIC PANEL
BUN: 27 mg/dL — ABNORMAL HIGH (ref 6–23)
CALCIUM: 9.4 mg/dL (ref 8.4–10.5)
CO2: 26 meq/L (ref 19–32)
Chloride: 105 mEq/L (ref 96–112)
Creatinine, Ser: 1.45 mg/dL (ref 0.40–1.50)
GFR: 53.3 mL/min — ABNORMAL LOW (ref >60.00)
GLUCOSE: 96 mg/dL (ref 70–99)
Potassium: 3.8 mEq/L (ref 3.5–5.1)
Sodium: 138 mEq/L (ref 135–145)

## 2014-08-07 ENCOUNTER — Telehealth: Payer: Self-pay | Admitting: Family Medicine

## 2014-08-07 ENCOUNTER — Other Ambulatory Visit: Payer: Self-pay | Admitting: Family Medicine

## 2014-08-07 DIAGNOSIS — R7989 Other specified abnormal findings of blood chemistry: Secondary | ICD-10-CM

## 2014-08-07 NOTE — Telephone Encounter (Signed)
Left message for patient to return call.

## 2014-08-07 NOTE — Telephone Encounter (Signed)
Pt call to say this is his 3rd day off the following med quinapril (ACCUPRIL) 40 MG tablet and today he has a dull headache and some dizzyness and would like a call back .

## 2014-08-07 NOTE — Telephone Encounter (Signed)
Pt is informed. Lab is ordered.

## 2014-08-07 NOTE — Telephone Encounter (Signed)
IF he is drinking well and staying hydrated can start back the Quinapril.  i recommend one more repeat of BMP next week to make sure his kidney fxn back to normal.

## 2014-08-14 ENCOUNTER — Other Ambulatory Visit (INDEPENDENT_AMBULATORY_CARE_PROVIDER_SITE_OTHER): Payer: 59

## 2014-08-14 DIAGNOSIS — R7989 Other specified abnormal findings of blood chemistry: Secondary | ICD-10-CM

## 2014-08-14 DIAGNOSIS — R748 Abnormal levels of other serum enzymes: Secondary | ICD-10-CM | POA: Diagnosis not present

## 2014-08-14 LAB — BASIC METABOLIC PANEL
BUN: 13 mg/dL (ref 6–23)
CHLORIDE: 100 meq/L (ref 96–112)
CO2: 31 mEq/L (ref 19–32)
CREATININE: 0.89 mg/dL (ref 0.40–1.50)
Calcium: 9.2 mg/dL (ref 8.4–10.5)
GFR: 93.6 mL/min (ref >60.00)
Glucose, Bld: 90 mg/dL (ref 70–99)
Potassium: 3.5 mEq/L (ref 3.5–5.1)
Sodium: 136 mEq/L (ref 135–145)

## 2014-08-20 ENCOUNTER — Other Ambulatory Visit: Payer: Self-pay | Admitting: Family Medicine

## 2014-11-20 ENCOUNTER — Ambulatory Visit (INDEPENDENT_AMBULATORY_CARE_PROVIDER_SITE_OTHER): Payer: 59 | Admitting: Family Medicine

## 2014-11-20 ENCOUNTER — Encounter: Payer: Self-pay | Admitting: Family Medicine

## 2014-11-20 VITALS — BP 117/77 | HR 69 | Temp 98.5°F | Ht 70.0 in | Wt 194.0 lb

## 2014-11-20 DIAGNOSIS — K1120 Sialoadenitis, unspecified: Secondary | ICD-10-CM | POA: Diagnosis not present

## 2014-11-20 MED ORDER — AMOXICILLIN-POT CLAVULANATE 875-125 MG PO TABS: 1.0000 | ORAL_TABLET | Freq: Two times a day (BID) | ORAL | Status: DC

## 2014-11-20 NOTE — Progress Notes (Signed)
Pre visit review using our clinic review tool, if applicable. No additional management support is needed unless otherwise documented below in the visit note. 

## 2014-11-23 ENCOUNTER — Encounter: Payer: Self-pay | Admitting: Family Medicine

## 2014-11-23 NOTE — Progress Notes (Signed)
   Subjective:    Patient ID: Peter Hughes, male    DOB: February 25, 1955, 59 y.o.   MRN: [id number]  HPI Here for the onset early this am of swelling and mild pain on the right side of the face. Chewing is mildly painful. No fever. No ST or sinus symptoms.    Review of Systems  Constitutional: Negative.   HENT: Positive for facial swelling. Negative for ear pain, mouth sores, postnasal drip and sore throat.   Eyes: Negative.   Respiratory: Negative.   Cardiovascular: Negative.        Objective:   Physical Exam  Constitutional: He appears well-developed and well-nourished.  HENT:  Right Ear: External ear normal.  Left Ear: External ear normal.  Nose: Nose normal.  Mouth/Throat: Oropharynx is clear and moist.  The right side of the face and right cheek are swollen and tender. The teeth and OP are clear.   Eyes: Conjunctivae are normal.  Neck: No thyromegaly present.  Cardiovascular: Normal rate, regular rhythm, normal heart sounds and intact distal pulses.   Pulmonary/Chest: Effort normal and breath sounds normal.  Lymphadenopathy:    He has no cervical adenopathy.          Assessment & Plan:  Parotitis, treat with Augmentin and hot compresses. Recheck prn

## 2015-02-23 ENCOUNTER — Other Ambulatory Visit: Payer: Self-pay | Admitting: Family Medicine

## 2015-02-26 ENCOUNTER — Ambulatory Visit (INDEPENDENT_AMBULATORY_CARE_PROVIDER_SITE_OTHER): Payer: 59 | Admitting: Adult Health

## 2015-02-26 ENCOUNTER — Encounter: Payer: Self-pay | Admitting: Adult Health

## 2015-02-26 VITALS — BP 140/84 | HR 86 | Temp 97.7°F | Wt 189.4 lb

## 2015-02-26 DIAGNOSIS — M79602 Pain in left arm: Secondary | ICD-10-CM | POA: Diagnosis not present

## 2015-02-26 LAB — BASIC METABOLIC PANEL
BUN: 12 mg/dL (ref 6–23)
CO2: 29 mEq/L (ref 19–32)
Calcium: 9.6 mg/dL (ref 8.4–10.5)
Chloride: 101 mEq/L (ref 96–112)
Creatinine, Ser: 0.83 mg/dL (ref 0.40–1.50)
GFR: 101.26 mL/min (ref >60.00)
GLUCOSE: 124 mg/dL — AB (ref 70–99)
Potassium: 3.4 mEq/L — ABNORMAL LOW (ref 3.5–5.1)
Sodium: 137 mEq/L (ref 135–145)

## 2015-02-26 LAB — MAGNESIUM: Magnesium: 2.2 mg/dL (ref 1.5–2.5)

## 2015-02-26 MED ORDER — CYCLOBENZAPRINE HCL 10 MG PO TABS: 10.0000 mg | ORAL_TABLET | Freq: Three times a day (TID) | ORAL | Status: DC | PRN

## 2015-02-26 NOTE — Patient Instructions (Signed)
It was great meeting you today!  Your EKG was normal. This is likely muscle in nature. We will check some labs and I will follow up with you regarding these labs.   I have sent in a muscle relaxer. Please try this medication before bed because it can make you sleepy.   Follow up if no improvement.

## 2015-02-26 NOTE — Progress Notes (Signed)
Subjective:    Patient ID: Peter Hughes, male    DOB: 1955-08-07, 60 y.o.   MRN: [id number]  HPI  60 year old male who presents today for left arm pain, the pain is described as " cramping" and is located mostly in the left bicep. He woke up with this pain. Denies any CP, SOB or pain that radiates up his jaw. Pain is worse when doing a bicep curl.   Has not tried anything besides fluids to help with the pain.   Review of Systems  Constitutional: Negative.   Respiratory: Negative.   Cardiovascular: Negative.   Gastrointestinal: Negative.   Musculoskeletal: Positive for myalgias. Negative for back pain, joint swelling, arthralgias, neck pain and neck stiffness.  Skin: Negative.   Neurological: Negative.   Psychiatric/Behavioral: Negative.   All other systems reviewed and are negative.  Past Medical History  Diagnosis Date  . HYPERTENSION 08/13/2008  . EPIDIDYMITIS 08/03/2009    Social History   Social History  . Marital Status: Married    Spouse Name: N/A  . Number of Children: N/A  . Years of Education: N/A   Occupational History  . Not on file.   Social History Main Topics  . Smoking status: Never Smoker   . Smokeless tobacco: Never Used  . Alcohol Use: No  . Drug Use: No  . Sexual Activity: Not on file   Other Topics Concern  . Not on file   Social History Narrative    Past Surgical History  Procedure Laterality Date  . Lipotripsy  2006    Kidney stone  . Hand surgery  2005    crush injury    Family History  Problem Relation Age of Onset  . Alcohol abuse Other   . Cancer Other     breast  . Diabetes Other   . Hypertension Other     Allergies  Allergen Reactions  . Benadryl [Diphenhydramine Hcl]     agitation  . Diphenhydramine Hcl     Reaction: agitation, a cough green syrup    Current Outpatient Prescriptions on File Prior to Visit  Medication Sig Dispense Refill  . amLODipine (NORVASC) 5 MG tablet TAKE 1 TABLET BY MOUTH EVERY DAY 30  tablet 5  . hydrochlorothiazide (HYDRODIURIL) 25 MG tablet TAKE 1 TABLET BY MOUTH EVERY DAY 45 tablet 0  . quinapril (ACCUPRIL) 40 MG tablet TAKE 1 TABLET BY MOUTH EVERY DAY 30 tablet 5  . triamcinolone cream (KENALOG) 0.1 % Apply 1 application topically 2 (two) times daily as needed. 30 g 2  . amoxicillin-clavulanate (AUGMENTIN) 875-125 MG tablet Take 1 tablet by mouth 2 (two) times daily. (Patient not taking: Reported on 02/26/2015) 20 tablet 0  . diphenoxylate-atropine (LOMOTIL) 2.5-0.025 MG per tablet Reported on 02/26/2015  0  . promethazine (PHENERGAN) 25 MG tablet Take 25 mg by mouth every 6 (six) hours as needed. Reported on 02/26/2015  0  . sildenafil (VIAGRA) 100 MG tablet Take 0.5-1 tablets (50-100 mg total) by mouth daily as needed for erectile dysfunction. (Patient not taking: Reported on 02/26/2015) 5 tablet 11   No current facility-administered medications on file prior to visit.    BP 140/84 mmHg  Pulse 86  Temp(Src) 97.7 F (36.5 C) (Oral)  Wt 189 lb 6.4 oz (85.911 kg)       Objective:   Physical Exam  Constitutional: He is oriented to person, place, and time. He appears well-developed and well-nourished. No distress.  Neck: Normal range of motion.  Neck supple.  Cardiovascular: Normal rate, regular rhythm, normal heart sounds and intact distal pulses.  Exam reveals no gallop and no friction rub.   No murmur heard. Pulmonary/Chest: Effort normal and breath sounds normal. No respiratory distress. He has no wheezes. He has no rales. He exhibits no tenderness.  Musculoskeletal: Normal range of motion. He exhibits no edema or tenderness.  No signs of trauma. No decreased ROM or strength.   Neurological: He is alert and oriented to person, place, and time.  Skin: Skin is warm and dry. No rash noted. He is not diaphoretic. No erythema. No pallor.  Psychiatric: He has a normal mood and affect. His behavior is normal. Judgment and thought content normal.  Nursing note and vitals  reviewed.      Assessment & Plan:  1. Left arm pain  - EKG 12-Lead- Sinus  Rhythm  -  Nonspecific T-abnormality. , Rate 68 - Basic metabolic panel - Magnesium - cyclobenzaprine (FLEXERIL) 10 MG tablet; Take 1 tablet (10 mg total) by mouth 3 (three) times daily as needed for muscle spasms.  Dispense: 30 tablet; Refill: 0

## 2015-02-26 NOTE — Progress Notes (Signed)
Pre visit review using our clinic review tool, if applicable. No additional management support is needed unless otherwise documented below in the visit note. 

## 2015-08-27 ENCOUNTER — Other Ambulatory Visit: Payer: Self-pay | Admitting: Family Medicine

## 2015-10-26 ENCOUNTER — Other Ambulatory Visit: Payer: Self-pay | Admitting: Family Medicine

## 2015-12-27 ENCOUNTER — Other Ambulatory Visit: Payer: Self-pay | Admitting: Family Medicine

## 2016-01-24 ENCOUNTER — Other Ambulatory Visit: Payer: Self-pay | Admitting: Family Medicine

## 2016-09-08 DIAGNOSIS — M79644 Pain in right finger(s): Secondary | ICD-10-CM | POA: Diagnosis not present

## 2016-09-14 DIAGNOSIS — M79644 Pain in right finger(s): Secondary | ICD-10-CM | POA: Diagnosis not present

## 2016-09-14 DIAGNOSIS — S61304A Unspecified open wound of right ring finger with damage to nail, initial encounter: Secondary | ICD-10-CM | POA: Diagnosis not present

## 2016-10-24 ENCOUNTER — Other Ambulatory Visit: Payer: Self-pay | Admitting: Family Medicine

## 2016-11-22 ENCOUNTER — Other Ambulatory Visit: Payer: Self-pay | Admitting: Family Medicine

## 2016-12-22 ENCOUNTER — Other Ambulatory Visit: Payer: Self-pay | Admitting: Family Medicine

## 2017-01-21 ENCOUNTER — Other Ambulatory Visit: Payer: Self-pay | Admitting: Family Medicine

## 2017-07-18 ENCOUNTER — Telehealth: Payer: Self-pay | Admitting: *Deleted

## 2017-07-18 ENCOUNTER — Encounter: Payer: Self-pay | Admitting: Family Medicine

## 2017-07-18 ENCOUNTER — Ambulatory Visit: Payer: 59 | Admitting: Family Medicine

## 2017-07-18 ENCOUNTER — Other Ambulatory Visit: Payer: Self-pay | Admitting: Family Medicine

## 2017-07-18 VITALS — BP 110/70 | HR 71 | Temp 97.6°F | Wt 196.4 lb

## 2017-07-18 DIAGNOSIS — I10 Essential (primary) hypertension: Secondary | ICD-10-CM

## 2017-07-18 DIAGNOSIS — Z0000 Encounter for general adult medical examination without abnormal findings: Secondary | ICD-10-CM

## 2017-07-18 MED ORDER — AMLODIPINE BESYLATE 5 MG PO TABS: ORAL_TABLET | ORAL | 11 refills | Status: DC

## 2017-07-18 MED ORDER — QUINAPRIL HCL 40 MG PO TABS: 40.0000 mg | ORAL_TABLET | Freq: Every day | ORAL | 11 refills | Status: DC

## 2017-07-18 NOTE — Progress Notes (Signed)
  Subjective:     Patient ID: Peter Hughes, male   DOB: [birth year], 62 y.o.   MRN: [id number]  HPI Patient seen for hypertension follow-up. Has not been seen in over 2 years. He actually has physical scheduled for July 29. He takes combination therapy with amlodipine and quinapril. Does not monitor blood pressures regularly. No recent headaches or dizziness. No chest pains. He also has history of type 2 diabetes currently managed without medication. Not monitoring blood sugars regularly. He will need A1c at follow-up for physical  Past Medical History:  Diagnosis Date  . EPIDIDYMITIS 08/03/2009  . HYPERTENSION 08/13/2008   Past Surgical History:  Procedure Laterality Date  . HAND SURGERY  2005   crush injury  . lipotripsy  2006   Kidney stone    reports that he has never smoked. He has never used smokeless tobacco. He reports that he does not drink alcohol or use drugs. family history includes Alcohol abuse in his other; Cancer in his other; Diabetes in his other; Hypertension in his other. Allergies  Allergen Reactions  . Benadryl [Diphenhydramine Hcl]     agitation  . Diphenhydramine Hcl     Reaction: agitation, a cough green syrup      Review of Systems  Constitutional: Negative for fatigue.  Eyes: Negative for visual disturbance.  Respiratory: Negative for cough, chest tightness and shortness of breath.   Cardiovascular: Negative for chest pain, palpitations and leg swelling.  Neurological: Negative for dizziness, syncope, weakness, light-headedness and headaches.       Objective:   Physical Exam  Constitutional: He is oriented to person, place, and time. He appears well-developed and well-nourished.  HENT:  Right Ear: External ear normal.  Left Ear: External ear normal.  Mouth/Throat: Oropharynx is clear and moist.  Eyes: Pupils are equal, round, and reactive to light.  Neck: Neck supple. No thyromegaly present.  Cardiovascular: Normal rate and regular rhythm.   Pulmonary/Chest: Effort normal and breath sounds normal. No respiratory distress. He has no wheezes. He has no rales.  Musculoskeletal: He exhibits no edema.  Neurological: He is alert and oriented to person, place, and time.       Assessment:     Hypertension stable and at goal    Plan:     -We refilled his amlodipine and quinapril for one year. -Obtain screening lab work at follow-up physical. Patient will check to see if insurance will cover labs in advance. Get A1c as well as other standard labs.  Kristian CoveyBruce W Emanii Bugbee MD [facility]

## 2017-07-18 NOTE — Telephone Encounter (Signed)
Labs ordered. Lab appointment made 

## 2017-07-18 NOTE — Patient Instructions (Signed)

## 2017-07-18 NOTE — Telephone Encounter (Signed)
Copied from CRM [phone]. Topic: Quick Communication - See Telephone Encounter >> Jul 18, 2017  8:54 AM Mare LoanBurton, Donna F wrote: Pt saw Dr. Caryl NeverBurchette this morning and was told to call his insurance company for labs on before his physical and shingles shot -he states that they do cover and would like to come back in soon to have his labs drawn the CPE is on the 29th  Best number [phone]

## 2017-07-30 ENCOUNTER — Other Ambulatory Visit (INDEPENDENT_AMBULATORY_CARE_PROVIDER_SITE_OTHER): Payer: 59

## 2017-07-30 DIAGNOSIS — I10 Essential (primary) hypertension: Secondary | ICD-10-CM | POA: Diagnosis not present

## 2017-07-30 DIAGNOSIS — Z0000 Encounter for general adult medical examination without abnormal findings: Secondary | ICD-10-CM

## 2017-07-30 LAB — LIPID PANEL
CHOLESTEROL: 144 mg/dL (ref 0–200)
HDL: 29.3 mg/dL — ABNORMAL LOW (ref >39.00)
LDL CALC: 89 mg/dL (ref 0–99)
NonHDL: 114.76
TRIGLYCERIDES: 130 mg/dL (ref 0.0–149.0)
Total CHOL/HDL Ratio: 5
VLDL: 26 mg/dL (ref 0.0–40.0)

## 2017-07-30 LAB — BASIC METABOLIC PANEL
BUN: 14 mg/dL (ref 6–23)
CO2: 26 mEq/L (ref 19–32)
Calcium: 9.2 mg/dL (ref 8.4–10.5)
Chloride: 102 mEq/L (ref 96–112)
Creatinine, Ser: 0.95 mg/dL (ref 0.40–1.50)
GFR: 85.93 mL/min (ref >60.00)
Glucose, Bld: 96 mg/dL (ref 70–99)
Potassium: 3.9 mEq/L (ref 3.5–5.1)
Sodium: 137 mEq/L (ref 135–145)

## 2017-07-30 LAB — TSH: TSH: 0.77 u[IU]/mL (ref 0.35–4.50)

## 2017-07-30 LAB — CBC WITH DIFFERENTIAL/PLATELET
BASOS PCT: 0.9 % (ref 0.0–3.0)
Basophils Absolute: 0.1 10*3/uL (ref 0.0–0.1)
EOS ABS: 0.2 10*3/uL (ref 0.0–0.7)
EOS PCT: 2.4 % (ref 0.0–5.0)
HEMATOCRIT: 46.9 % (ref 39.0–52.0)
HEMOGLOBIN: 15.8 g/dL (ref 13.0–17.0)
Lymphocytes Relative: 31.9 % (ref 12.0–46.0)
Lymphs Abs: 2.6 10*3/uL (ref 0.7–4.0)
MCHC: 33.7 g/dL (ref 30.0–36.0)
MCV: 88.8 fl (ref 78.0–100.0)
MONO ABS: 0.7 10*3/uL (ref 0.1–1.0)
Monocytes Relative: 9.1 % (ref 3.0–12.0)
NEUTROS ABS: 4.5 10*3/uL (ref 1.4–7.7)
Neutrophils Relative %: 55.7 % (ref 43.0–77.0)
PLATELETS: 321 10*3/uL (ref 150.0–400.0)
RBC: 5.28 Mil/uL (ref 4.22–5.81)
RDW: 13.4 % (ref 11.5–15.5)
WBC: 8 10*3/uL (ref 4.0–10.5)

## 2017-07-30 LAB — MICROALBUMIN / CREATININE URINE RATIO
CREATININE, U: 78.1 mg/dL
MICROALB/CREAT RATIO: 7.5 mg/g (ref 0.0–30.0)
Microalb, Ur: 5.9 mg/dL — ABNORMAL HIGH (ref 0.0–1.9)

## 2017-07-30 LAB — HEMOGLOBIN A1C: Hgb A1c MFr Bld: 6 % (ref 4.6–6.5)

## 2017-07-30 LAB — PSA: PSA: 0.15 ng/mL (ref 0.10–4.00)

## 2017-08-06 ENCOUNTER — Ambulatory Visit (INDEPENDENT_AMBULATORY_CARE_PROVIDER_SITE_OTHER): Payer: 59 | Admitting: Family Medicine

## 2017-08-06 ENCOUNTER — Encounter: Payer: Self-pay | Admitting: Family Medicine

## 2017-08-06 VITALS — BP 110/70 | HR 80 | Temp 98.1°F | Ht 70.25 in | Wt 192.4 lb

## 2017-08-06 DIAGNOSIS — Z0000 Encounter for general adult medical examination without abnormal findings: Secondary | ICD-10-CM

## 2017-08-06 NOTE — Patient Instructions (Signed)
We need to consider checking Hep C antibody with next blood draw.

## 2017-08-06 NOTE — Progress Notes (Signed)
Subjective:     Patient ID: Peter Hughes, male   DOB: March 14, 1955, 62 y.o.   MRN: [id number]  HPI Patient is here for physical exam. He takes amlodipine and quinapril for hypertension. He has declined any further colon cancer screening by colonoscopy or DNA-based screening. No history of hepatitis C screen.  Tetanus is up-to-date. He would like to get shingles vaccine and will check on pharmacy availability as well as get placed on waiting list here.  Reviewed with no major changes:  Past Medical History:  Diagnosis Date  . EPIDIDYMITIS 08/03/2009  . HYPERTENSION 08/13/2008   Past Surgical History:  Procedure Laterality Date  . HAND SURGERY  2005   crush injury  . lipotripsy  2006   Kidney stone    reports that he has never smoked. He has never used smokeless tobacco. He reports that he does not drink alcohol or use drugs. family history includes Alcohol abuse in his other; Cancer in his other; Diabetes in his other; Hypertension in his other. Allergies  Allergen Reactions  . Benadryl [Diphenhydramine Hcl]     agitation  . Diphenhydramine Hcl     Reaction: agitation, a cough green syrup     Review of Systems  Constitutional: Negative for activity change, appetite change, fatigue, fever and unexpected weight change.  HENT: Negative for congestion, ear pain and trouble swallowing.   Eyes: Negative for pain and visual disturbance.  Respiratory: Negative for cough, shortness of breath and wheezing.   Cardiovascular: Negative for chest pain and palpitations.  Gastrointestinal: Negative for abdominal distention, abdominal pain, blood in stool, constipation, diarrhea, nausea, rectal pain and vomiting.  Endocrine: Negative for polydipsia and polyuria.  Genitourinary: Negative for dysuria, hematuria and testicular pain.  Musculoskeletal: Negative for arthralgias and joint swelling.  Skin: Negative for rash.  Neurological: Negative for dizziness, syncope and headaches.   Hematological: Negative for adenopathy.  Psychiatric/Behavioral: Negative for confusion and dysphoric mood.       Objective:   Physical Exam  Constitutional: He is oriented to person, place, and time. He appears well-developed and well-nourished. No distress.  HENT:  Head: Normocephalic and atraumatic.  Right Ear: External ear normal.  Left Ear: External ear normal.  Mouth/Throat: Oropharynx is clear and moist.  Eyes: Pupils are equal, round, and reactive to light. Conjunctivae and EOM are normal.  Neck: Normal range of motion. Neck supple. No thyromegaly present.  Cardiovascular: Normal rate, regular rhythm and normal heart sounds.  No murmur heard. Pulmonary/Chest: No respiratory distress. He has no wheezes. He has no rales.  Abdominal: Soft. Bowel sounds are normal. He exhibits no distension and no mass. There is no tenderness. There is no rebound and no guarding.  Musculoskeletal: He exhibits no edema.  Lymphadenopathy:    He has no cervical adenopathy.  Neurological: He is alert and oriented to person, place, and time. He displays normal reflexes. No cranial nerve deficit.  Skin: No rash noted.  Psychiatric: He has a normal mood and affect.       Assessment:     Physical exam. Patient had labs prior to today's visit and these were reviewed with patient with no major concerns. The following issues were addressed    Plan:     -We again offered further colon cancer screening with colonoscopy and he declines -Patient will be placed on waiting list for shingles vaccine -Recommend hepatitis C antibody and he prefers to wait until next blood draw -Continue current medications -Recommend yearly flu vaccine  ,Jerelyn Trimarco  Dan Europe MD [facility] at [facility]

## 2017-08-28 ENCOUNTER — Ambulatory Visit (INDEPENDENT_AMBULATORY_CARE_PROVIDER_SITE_OTHER): Payer: 59 | Admitting: *Deleted

## 2017-08-28 DIAGNOSIS — Z23 Encounter for immunization: Secondary | ICD-10-CM | POA: Diagnosis not present

## 2017-08-28 NOTE — Progress Notes (Signed)
Per orders of Dr. Burchette, injection of Shingrix given by Soliana Kitko. Patient tolerated injection well. 

## 2017-09-27 ENCOUNTER — Encounter: Payer: Self-pay | Admitting: Adult Health

## 2017-09-27 ENCOUNTER — Ambulatory Visit: Payer: 59 | Admitting: Adult Health

## 2017-09-27 VITALS — BP 110/70 | HR 77 | Temp 98.2°F | Wt 195.8 lb

## 2017-09-27 DIAGNOSIS — L509 Urticaria, unspecified: Secondary | ICD-10-CM | POA: Diagnosis not present

## 2017-09-27 NOTE — Progress Notes (Signed)
Subjective:    Patient ID: Peter Hughes, male    DOB: 02-Feb-1955, 62 y.o.   MRN: [id number]  HPI 62 year old male who  has a past medical history of EPIDIDYMITIS (08/03/2009) and HYPERTENSION (08/13/2008). He presents to the office today for an acute issue of rash. Symptoms first started last night with " itching" but did not have a rash. This morning when he got out of the shower he noticed " red welts" on his bilateral lower torso and bilateral legs and thighs. Does report itching. As the day has progressed the welts and itching have started to resolve   He reports no change in diet, soaps, lotions, or detergents.   Denies any SOB or CP    Review of Systems See HPI   Past Medical History:  Diagnosis Date  . EPIDIDYMITIS 08/03/2009  . HYPERTENSION 08/13/2008    Social History   Socioeconomic History  . Marital status: Married    Spouse name: Not on file  . Number of children: Not on file  . Years of education: Not on file  . Highest education level: Not on file  Occupational History  . Not on file  Social Needs  . Financial resource strain: Not on file  . Food insecurity:    Worry: Not on file    Inability: Not on file  . Transportation needs:    Medical: Not on file    Non-medical: Not on file  Tobacco Use  . Smoking status: Never Smoker  . Smokeless tobacco: Never Used  Substance and Sexual Activity  . Alcohol use: No    Alcohol/week: 0.0 standard drinks  . Drug use: No  . Sexual activity: Not on file  Lifestyle  . Physical activity:    Days per week: Not on file    Minutes per session: Not on file  . Stress: Not on file  Relationships  . Social connections:    Talks on phone: Not on file    Gets together: Not on file    Attends religious service: Not on file    Active member of club or organization: Not on file    Attends meetings of clubs or organizations: Not on file    Relationship status: Not on file  . Intimate partner violence:    Fear of current  or ex partner: Not on file    Emotionally abused: Not on file    Physically abused: Not on file    Forced sexual activity: Not on file  Other Topics Concern  . Not on file  Social History Narrative  . Not on file    Past Surgical History:  Procedure Laterality Date  . HAND SURGERY  2005   crush injury  . lipotripsy  2006   Kidney stone    Family History  Problem Relation Age of Onset  . Alcohol abuse Other   . Cancer Other        breast  . Diabetes Other   . Hypertension Other     Allergies  Allergen Reactions  . Benadryl [Diphenhydramine Hcl]     agitation  . Diphenhydramine Hcl     Reaction: agitation, a cough green syrup    Current Outpatient Medications on File Prior to Visit  Medication Sig Dispense Refill  . amLODipine (NORVASC) 5 MG tablet TAKE 1 TABLET BY MOUTH EVERY DAY NEED APPOINTMENT 30 tablet 11  . quinapril (ACCUPRIL) 40 MG tablet Take 1 tablet (40 mg total) by mouth daily.  30 tablet 11   No current facility-administered medications on file prior to visit.     BP 110/70 (BP Location: Left Arm, Patient Position: Sitting, Cuff Size: Normal)   Pulse 77   Temp 98.2 F (36.8 C) (Oral)   Wt 195 lb 12.8 oz (88.8 kg)   SpO2 96%   BMI 27.89 kg/m       Objective:   Physical Exam  Constitutional: He is oriented to person, place, and time. He appears well-developed and well-nourished. No distress.  Cardiovascular: Normal rate, regular rhythm, normal heart sounds and intact distal pulses.  Pulmonary/Chest: Effort normal and breath sounds normal.  Neurological: He is alert and oriented to person, place, and time.  Skin: Skin is warm and dry. Rash noted. Rash is urticarial. He is not diaphoretic.  Red raised welts consistent with hives noticed on right lower torso and right thigh. Appears to be resolving   Psychiatric: He has a normal mood and affect. His behavior is normal. Judgment and thought content normal.  Vitals reviewed.     Assessment & Plan:    1. Hives - Unknown cause.  - Advised switching to [employer] soap  - We talked about various treatment, he opted for claritin for the next week  - advised follow up if symptoms do not resolve   Shirline Freesory Jovanne Riggenbach, NP

## 2017-09-28 ENCOUNTER — Telehealth: Payer: Self-pay | Admitting: Adult Health

## 2017-09-28 ENCOUNTER — Telehealth: Payer: Self-pay | Admitting: Family Medicine

## 2017-09-28 NOTE — Telephone Encounter (Signed)
Patient states he tried over-the-counter medication for rash as discussed at yesterday's appointment.  He states it isn't working and wants to go ahead and get the Prednisone called in.    Pharmacy: [facility] [place]

## 2017-09-28 NOTE — Telephone Encounter (Signed)
Disregard

## 2017-09-28 NOTE — Telephone Encounter (Signed)
Copied from CRM [phone]. Topic: Quick Communication - See Telephone Encounter >> Sep 28, 2017  3:38 PM Jens SomMedley, Jennifer A wrote: CRM for notification. See Telephone encounter for: 09/28/17. Patient is calling to check on the prescription for Pretazone. Please advise Patient call back number [phone]

## 2017-10-01 ENCOUNTER — Other Ambulatory Visit: Payer: Self-pay

## 2017-10-01 MED ORDER — PREDNISONE 10 MG PO TABS: ORAL_TABLET | ORAL | 0 refills | Status: DC

## 2017-10-01 NOTE — Telephone Encounter (Signed)
Rx sent 

## 2017-10-01 NOTE — Telephone Encounter (Signed)
Last OV with Lennox Grumblesory N. 09/27/17, No future OV  Please see message.

## 2017-10-01 NOTE — Telephone Encounter (Signed)
Called patient and let him know that this has been sent to the pharmacy and he stated that his rash is getting a little better and he also stated that he would call and make an appointment if it is not resolving. Patient verbalized an understanding.

## 2017-10-01 NOTE — Telephone Encounter (Signed)
Prednisone 10 mg taper: 4-4-4-3-3-2-2-1-1 and office follow up with me if not resolving with the prednisone.

## 2017-10-29 ENCOUNTER — Ambulatory Visit: Payer: 59 | Admitting: Family Medicine

## 2017-10-29 DIAGNOSIS — Z23 Encounter for immunization: Secondary | ICD-10-CM

## 2018-02-21 ENCOUNTER — Ambulatory Visit: Payer: 59 | Admitting: Podiatry

## 2018-02-21 ENCOUNTER — Encounter: Payer: Self-pay | Admitting: Podiatry

## 2018-02-21 VITALS — BP 134/75 | HR 75 | Resp 16

## 2018-02-21 DIAGNOSIS — L603 Nail dystrophy: Secondary | ICD-10-CM | POA: Diagnosis not present

## 2018-02-21 DIAGNOSIS — B351 Tinea unguium: Secondary | ICD-10-CM | POA: Diagnosis not present

## 2018-02-21 DIAGNOSIS — Z79899 Other long term (current) drug therapy: Secondary | ICD-10-CM

## 2018-02-21 DIAGNOSIS — L601 Onycholysis: Secondary | ICD-10-CM | POA: Diagnosis not present

## 2018-02-21 NOTE — Addendum Note (Signed)
Addended by: Kristian Covey on: 02/21/2018 01:39 PM   Modules accepted: Orders

## 2018-02-21 NOTE — Progress Notes (Signed)
  Subjective:  Patient ID: Peter Hughes, male    DOB: 29-Mar-1955,  MRN: [id number] HPI Chief Complaint  Patient presents with  . Nail Problem    Toenails bilateral - thick, discolored x years, injury to hallux initially, tries to keep trimmed  . New Patient (Initial Visit)    63 y.o. male presents with the above complaint.   ROS: Denies fever chills nausea vomiting muscle aches pains calf pain back pain chest pain shortness of breath.  Toenails are long thick yellow dystrophic onychomycotic hallux second digit right foot and hallux and second digit on the left foot.  Otherwise and other nail plates look normal.  Past Medical History:  Diagnosis Date  . EPIDIDYMITIS 08/03/2009  . HYPERTENSION 08/13/2008   Past Surgical History:  Procedure Laterality Date  . HAND SURGERY  2005   crush injury  . lipotripsy  2006   Kidney stone    Current Outpatient Medications:  .  amLODipine (NORVASC) 5 MG tablet, TAKE 1 TABLET BY MOUTH EVERY DAY NEED APPOINTMENT, Disp: 30 tablet, Rfl: 11 .  quinapril (ACCUPRIL) 40 MG tablet, Take 1 tablet (40 mg total) by mouth daily., Disp: 30 tablet, Rfl: 11  Allergies  Allergen Reactions  . Benadryl [Diphenhydramine Hcl]     agitation  . Diphenhydramine Hcl     Reaction: agitation, a cough green syrup   Review of Systems Objective:   Vitals:   02/21/18 0944  BP: 134/75  Pulse: 75  Resp: 16    General: Well developed, nourished, in no acute distress, alert and oriented x3   Dermatological: Skin is warm, dry and supple bilateral. Nails x 10 are well maintained; remaining integument appears unremarkable at this time. There are no open sores, no preulcerative lesions, no rash or signs of infection present.  Onychodystrophy possible onychomycosis hallux and second digit bilaterally.  Vascular: Dorsalis Pedis artery and Posterior Tibial artery pedal pulses are 2/4 bilateral with immedate capillary fill time. Pedal hair growth present. No varicosities and  no lower extremity edema present bilateral.   Neruologic: Grossly intact via light touch bilateral. Vibratory intact via tuning fork bilateral. Protective threshold with Semmes Wienstein monofilament intact to all pedal sites bilateral. Patellar and Achilles deep tendon reflexes 2+ bilateral. No Babinski or clonus noted bilateral.   Musculoskeletal: No gross boney pedal deformities bilateral. No pain, crepitus, or limitation noted with foot and ankle range of motion bilateral. Muscular strength 5/5 in all groups tested bilateral.  Gait: Unassisted, Nonantalgic.    Radiographs: None taken  Assessment & Plan:   Assessment: Nail dystrophy cannot rule out onychomycosis hallux bilaterally.  Plan: Samples of the nails skin were taken for pathologic evaluation to be sent for pathology follow-up with him in 1 month.     Timon Geissinger T. [place], [place]

## 2018-03-21 ENCOUNTER — Ambulatory Visit: Payer: 59 | Admitting: Podiatry

## 2018-03-21 ENCOUNTER — Encounter: Payer: Self-pay | Admitting: Podiatry

## 2018-03-21 ENCOUNTER — Other Ambulatory Visit: Payer: Self-pay

## 2018-03-21 DIAGNOSIS — L603 Nail dystrophy: Secondary | ICD-10-CM | POA: Diagnosis not present

## 2018-03-21 MED ORDER — TERBINAFINE HCL 250 MG PO TABS: 250.0000 mg | ORAL_TABLET | Freq: Every day | ORAL | 0 refills | Status: DC

## 2018-03-21 NOTE — Progress Notes (Signed)
Presents today for follow-up of the nail pathology.  States that things are unchanged.  Objective: Vital signs are stable alert and oriented x3.  Pulses are palpable.  Pathology report does demonstrate saprophytic fungus.  Assessment: Onychomycosis with a separate fight.  Plan: At this point we started him on oral antifungal Lamisil therapy 250 mg tablets 1 p.o. daily told him that is about 85% effective offered him laser therapy which he declined.  At this point we will request a liver profile since his liver profile in the computer is outdated.  I will follow-up with him with results should they be abnormal.  Otherwise I will follow-up with him in 30 days and and another liver profile will be performed.  We did discuss the pros and cons of the oral medication as opposed to the laser therapy understands the risks involved I will follow-up with him soon.

## 2018-03-21 NOTE — Addendum Note (Signed)
Addended by: Kristian Covey on: 03/21/2018 04:13 PM   Modules accepted: Orders

## 2018-03-22 DIAGNOSIS — Z79899 Other long term (current) drug therapy: Secondary | ICD-10-CM | POA: Diagnosis not present

## 2018-03-23 LAB — HEPATIC FUNCTION PANEL
AG RATIO: 1.7 (calc) (ref 1.0–2.5)
ALT: 26 U/L (ref 9–46)
AST: 24 U/L (ref 10–35)
Albumin: 4.5 g/dL (ref 3.6–5.1)
Alkaline phosphatase (APISO): 81 U/L (ref 35–144)
BILIRUBIN INDIRECT: 0.7 mg/dL (ref 0.2–1.2)
Bilirubin, Direct: 0.1 mg/dL (ref 0.0–0.2)
GLOBULIN: 2.7 g/dL (ref 1.9–3.7)
Total Bilirubin: 0.8 mg/dL (ref 0.2–1.2)
Total Protein: 7.2 g/dL (ref 6.1–8.1)

## 2018-03-26 ENCOUNTER — Telehealth: Payer: Self-pay | Admitting: *Deleted

## 2018-03-26 MED ORDER — TERBINAFINE HCL 250 MG PO TABS: 250.0000 mg | ORAL_TABLET | Freq: Every day | ORAL | 0 refills | Status: DC

## 2018-03-26 NOTE — Telephone Encounter (Signed)
Left message informing pt of Dr. Hyatt's review of results and orders. 

## 2018-03-26 NOTE — Telephone Encounter (Signed)
-----   Message from Elinor Parkinson, [place] sent at 03/25/2018  7:02 AM EDT ----- Blood work looks good and may continue medication.

## 2018-04-11 ENCOUNTER — Telehealth: Payer: Self-pay

## 2018-04-11 DIAGNOSIS — Z79899 Other long term (current) drug therapy: Secondary | ICD-10-CM

## 2018-04-11 DIAGNOSIS — L603 Nail dystrophy: Secondary | ICD-10-CM

## 2018-04-11 NOTE — Telephone Encounter (Signed)
Spoke with patient in regards to his scheduled appointment next week for medication check.  We discussed current use of Lamisil, patient denies any side effects, GI distress or any adverse reactions to the medication. I told him that we would fax of the lab orders for a hepatic function panel to the [facility] on [facility] in [place]. He is to complete the blood work next week, then await results before continuing medication.

## 2018-04-18 ENCOUNTER — Ambulatory Visit: Payer: 59 | Admitting: Podiatry

## 2018-05-14 DIAGNOSIS — L603 Nail dystrophy: Secondary | ICD-10-CM | POA: Diagnosis not present

## 2018-05-14 DIAGNOSIS — Z79899 Other long term (current) drug therapy: Secondary | ICD-10-CM | POA: Diagnosis not present

## 2018-05-15 ENCOUNTER — Telehealth: Payer: Self-pay | Admitting: *Deleted

## 2018-05-15 LAB — HEPATIC FUNCTION PANEL
AG Ratio: 1.6 (calc) (ref 1.0–2.5)
ALT: 26 U/L (ref 9–46)
AST: 18 U/L (ref 10–35)
Albumin: 4.4 g/dL (ref 3.6–5.1)
Alkaline phosphatase (APISO): 88 U/L (ref 35–144)
Bilirubin, Direct: 0.1 mg/dL (ref 0.0–0.2)
Globulin: 2.7 g/dL (calc) (ref 1.9–3.7)
Indirect Bilirubin: 0.4 mg/dL (calc) (ref 0.2–1.2)
Total Bilirubin: 0.5 mg/dL (ref 0.2–1.2)
Total Protein: 7.1 g/dL (ref 6.1–8.1)

## 2018-05-15 NOTE — Telephone Encounter (Signed)
-----   Message from Elinor Parkinson, [place] sent at 05/15/2018  8:00 AM EDT ----- Blood work looks perfect continue medication.

## 2018-05-15 NOTE — Telephone Encounter (Signed)
I informed pt of Dr. Hyatt's review of results and orders. 

## 2018-06-02 ENCOUNTER — Other Ambulatory Visit: Payer: Self-pay | Admitting: Podiatry

## 2018-06-04 NOTE — Telephone Encounter (Signed)
Okay to refill #90 with 4 month follow up

## 2018-07-04 ENCOUNTER — Ambulatory Visit: Payer: 59 | Admitting: Podiatry

## 2018-07-04 ENCOUNTER — Encounter: Payer: Self-pay | Admitting: Podiatry

## 2018-07-04 ENCOUNTER — Other Ambulatory Visit: Payer: Self-pay

## 2018-07-04 VITALS — Temp 97.6°F

## 2018-07-04 DIAGNOSIS — M79676 Pain in unspecified toe(s): Secondary | ICD-10-CM | POA: Diagnosis not present

## 2018-07-04 DIAGNOSIS — L603 Nail dystrophy: Secondary | ICD-10-CM

## 2018-07-04 DIAGNOSIS — B351 Tinea unguium: Secondary | ICD-10-CM | POA: Diagnosis not present

## 2018-07-04 MED ORDER — TERBINAFINE HCL 250 MG PO TABS: 250.0000 mg | ORAL_TABLET | Freq: Every day | ORAL | 0 refills | Status: DC

## 2018-07-04 NOTE — Patient Instructions (Signed)
Dr. Hyatt has sent over a refill for Lamisil to your pharmacy today. The instructions on your bottle will say "take 1 tablet daily", however, he would like for you to take one pill every other day. He will follow up with you in 3 months to re-evaluate your toenails. 

## 2018-07-04 NOTE — Progress Notes (Signed)
He presents today for follow-up of his nail fungus he is completed 120 days of Lamisil.  I think is doing fine.  He would like me to cut his nails for him.  Objective: Vital signs are stable alert and x3 his nails appear to be growing out proximally but due to his nail dystrophy they are very slow to grow.  Assessment: Slowly growing painful onychomycotic nails long-term therapy.  Plan: Debrided toenails 1 through 5 bilateral covered service.  Started him on 1 tablet every other day of Lamisil.  I will follow-up with him in 3 months

## 2018-07-05 ENCOUNTER — Other Ambulatory Visit: Payer: Self-pay | Admitting: Family Medicine

## 2018-07-08 ENCOUNTER — Telehealth: Payer: Self-pay | Admitting: Family Medicine

## 2018-07-08 NOTE — Telephone Encounter (Signed)
Pt called in to get a medication refill and did not want to come in or have a virtual appointment.  Pt state that he does not do virtual appointments where someone calls or sends him something because someone could get his information and mess him up for good.  Pt state if he could just get a refill on his mediation for 30 days but would not tell me what medication it was.  He state that he did not want to come in the office b/c of the coronavirus.  I told him that he could do a virtual but he state that he doesn't do that b/c he doesn't know whose on the other end if he didn't call them.  Pt would like to have a call back.

## 2018-07-08 NOTE — Telephone Encounter (Signed)
Called patient and left a detailed message to let patient know he can set up a telephone visit only to update his chart for his medication refills.  OK for PEC to discuss and/or advise.  CRM Created.

## 2018-07-30 ENCOUNTER — Other Ambulatory Visit: Payer: Self-pay | Admitting: Family Medicine

## 2018-08-23 ENCOUNTER — Encounter: Payer: Self-pay | Admitting: Family Medicine

## 2018-08-23 ENCOUNTER — Ambulatory Visit: Payer: 59 | Admitting: Family Medicine

## 2018-08-23 ENCOUNTER — Other Ambulatory Visit: Payer: Self-pay

## 2018-08-23 VITALS — BP 124/82 | HR 80 | Temp 98.0°F | Ht 70.0 in | Wt 205.9 lb

## 2018-08-23 DIAGNOSIS — I10 Essential (primary) hypertension: Secondary | ICD-10-CM | POA: Diagnosis not present

## 2018-08-23 MED ORDER — QUINAPRIL HCL 40 MG PO TABS: 40.0000 mg | ORAL_TABLET | Freq: Every day | ORAL | 3 refills | Status: DC

## 2018-08-23 MED ORDER — AMLODIPINE BESYLATE 5 MG PO TABS: ORAL_TABLET | ORAL | 3 refills | Status: DC

## 2018-08-23 NOTE — Patient Instructions (Signed)
Consider setting up complete physical some time in the next year.

## 2018-08-23 NOTE — Progress Notes (Signed)
  Subjective:     Patient ID: Peter Hughes, male   DOB: April 05, 1955, 63 y.o.   MRN: [id number]  HPI Here for follow-up regarding hypertension.  He takes combination therapy with amlodipine and quinapril.  His blood pressures been stable.  Denies any recent dizziness, headaches, or chest pains.  Patient has had elevated blood sugars in the past.  Not monitoring currently.  He has a brother with type 2 diabetes.  No polyuria or polydipsia.  He declines A1c today.  Compliant with medications.  Needs refills  Past Medical History:  Diagnosis Date  . EPIDIDYMITIS 08/03/2009  . HYPERTENSION 08/13/2008   Past Surgical History:  Procedure Laterality Date  . HAND SURGERY  2005   crush injury  . lipotripsy  2006   Kidney stone    reports that he has never smoked. He has never used smokeless tobacco. He reports that he does not drink alcohol or use drugs. family history includes Alcohol abuse in an other family member; Cancer in an other family member; Diabetes in an other family member; Hypertension in an other family member. Allergies  Allergen Reactions  . Benadryl [Diphenhydramine Hcl]     agitation  . Diphenhydramine Hcl     Reaction: agitation, a cough green syrup     Review of Systems  Constitutional: Negative for fatigue and unexpected weight change.  Eyes: Negative for visual disturbance.  Respiratory: Negative for cough, chest tightness and shortness of breath.   Cardiovascular: Negative for chest pain, palpitations and leg swelling.  Endocrine: Negative for polydipsia and polyuria.  Neurological: Negative for dizziness, syncope, weakness, light-headedness and headaches.       Objective:   Physical Exam Constitutional:      Appearance: He is well-developed.  HENT:     Right Ear: External ear normal.     Left Ear: External ear normal.  Eyes:     Pupils: Pupils are equal, round, and reactive to light.  Neck:     Musculoskeletal: Neck supple.     Thyroid: No thyromegaly.   Cardiovascular:     Rate and Rhythm: Normal rate and regular rhythm.  Pulmonary:     Effort: Pulmonary effort is normal. No respiratory distress.     Breath sounds: Normal breath sounds. No wheezing or rales.  Neurological:     Mental Status: He is alert and oriented to person, place, and time.        Assessment:     Hypertension- well controlled  History of prediabetes    Plan:     -Refill medications for 1 year -Regular aerobic exercise recommended along with weight control. -Recommend set up complete physical at some point within the next year.  Obtain follow-up A1c then.  Eulas Post MD [facility]

## 2018-10-03 ENCOUNTER — Other Ambulatory Visit: Payer: Self-pay

## 2018-10-03 ENCOUNTER — Encounter: Payer: Self-pay | Admitting: Podiatry

## 2018-10-03 ENCOUNTER — Ambulatory Visit: Payer: 59 | Admitting: Podiatry

## 2018-10-03 DIAGNOSIS — L603 Nail dystrophy: Secondary | ICD-10-CM | POA: Diagnosis not present

## 2018-10-03 MED ORDER — TERBINAFINE HCL 250 MG PO TABS: 250.0000 mg | ORAL_TABLET | Freq: Every day | ORAL | 0 refills | Status: DC

## 2018-10-03 NOTE — Progress Notes (Signed)
He presents today for follow-up of his nail fungus he is completed his first round of every other day therapy 30 tablets 1 tablet every other day over 3 months states that he is doing quite well and his toenails are growing out nicely.  Objective: Vital signs are stable he is alert and oriented x3.  His toenails are growing out very nicely with the exception of the hallux right and the second toes bilaterally.  To which they both demonstrate nail dystrophy as well as onychomycosis.  Is painful on palpation.  Assessment: Pain in limb secondary to onychomycosis and long-term therapy with onychomycosis and Lamisil.  Plan: Debridement of toenails #2 bilaterally #1 bilaterally today also put her back on Lamisil 30 tablets 1 every other day and I will follow-up with him in 3 months.

## 2019-01-07 ENCOUNTER — Encounter: Payer: Self-pay | Admitting: Podiatry

## 2019-01-07 ENCOUNTER — Ambulatory Visit: Payer: 59 | Admitting: Podiatry

## 2019-01-07 ENCOUNTER — Other Ambulatory Visit: Payer: Self-pay

## 2019-01-07 DIAGNOSIS — B351 Tinea unguium: Secondary | ICD-10-CM | POA: Diagnosis not present

## 2019-01-07 DIAGNOSIS — M79676 Pain in unspecified toe(s): Secondary | ICD-10-CM | POA: Diagnosis not present

## 2019-01-07 MED ORDER — TERBINAFINE HCL 250 MG PO TABS: 250.0000 mg | ORAL_TABLET | Freq: Every day | ORAL | 0 refills | Status: DC

## 2019-01-07 NOTE — Progress Notes (Signed)
He presents today for follow-up of his nail fungus he has completed 2 rounds of every other day therapy plus his initial 120.  He states that the right muscle seem to be doing much for the left was almost clear.  Objective: Vital signs are stable he is alert oriented x3 no erythema edema cellulitis drainage or odor no open lesions or wounds.  Nail plates appear to be almost clear but they are elongated and thickened.  Assessment: Resolving onychomycosis left foot.  Nail dystrophy is 2 and 3 on the right foot.  Plan: Discussed etiology pathology conservative therapies at this point time went ahead and performed nail debridement for him.  He will continue the use of his Lamisil therapy every other day for the next 3 months follow-up with him in 3.

## 2019-04-08 ENCOUNTER — Ambulatory Visit: Payer: 59 | Admitting: Podiatry

## 2019-07-03 DIAGNOSIS — I2510 Atherosclerotic heart disease of native coronary artery without angina pectoris: Secondary | ICD-10-CM | POA: Insufficient documentation

## 2019-07-29 ENCOUNTER — Encounter: Payer: Self-pay | Admitting: Podiatry

## 2019-07-29 ENCOUNTER — Ambulatory Visit: Payer: 59 | Admitting: Podiatry

## 2019-07-29 ENCOUNTER — Other Ambulatory Visit: Payer: Self-pay

## 2019-07-29 DIAGNOSIS — L603 Nail dystrophy: Secondary | ICD-10-CM

## 2019-07-29 DIAGNOSIS — M79676 Pain in unspecified toe(s): Secondary | ICD-10-CM | POA: Diagnosis not present

## 2019-07-29 DIAGNOSIS — B351 Tinea unguium: Secondary | ICD-10-CM | POA: Diagnosis not present

## 2019-07-29 NOTE — Progress Notes (Signed)
He presents today for follow-up of his nail fungus.  He has completed a third round of every other day dosing stating that they are looking better.  States that he had a small heart attack few months ago and is now currently taking several different medications.  Objective: Vital signs are stable he is alert and oriented x3 toenails are still thick yellow dystrophic clinically mycotic I do not notice a whole lot of further clearing since last visit.  Assessment: Pain in limb secondary to onychomycosis and long-term therapy with Lamisil.  Plan: At this point I think we should discontinue the Lamisil but I did debride his nails bilaterally.  I will continue to do so for him as long as he wants me to.

## 2019-07-31 ENCOUNTER — Ambulatory Visit (INDEPENDENT_AMBULATORY_CARE_PROVIDER_SITE_OTHER): Payer: 59 | Admitting: Family Medicine

## 2019-07-31 ENCOUNTER — Other Ambulatory Visit: Payer: Self-pay

## 2019-07-31 ENCOUNTER — Encounter: Payer: Self-pay | Admitting: Family Medicine

## 2019-07-31 VITALS — BP 130/76 | HR 80 | Temp 97.8°F | Wt 199.0 lb

## 2019-07-31 DIAGNOSIS — S39013A Strain of muscle, fascia and tendon of pelvis, initial encounter: Secondary | ICD-10-CM | POA: Diagnosis not present

## 2019-07-31 DIAGNOSIS — S39011A Strain of muscle, fascia and tendon of abdomen, initial encounter: Secondary | ICD-10-CM

## 2019-07-31 DIAGNOSIS — I10 Essential (primary) hypertension: Secondary | ICD-10-CM | POA: Diagnosis not present

## 2019-07-31 MED ORDER — QUINAPRIL HCL 40 MG PO TABS: 40.0000 mg | ORAL_TABLET | Freq: Every day | ORAL | 0 refills | Status: DC

## 2019-07-31 MED ORDER — AMLODIPINE BESYLATE 5 MG PO TABS: ORAL_TABLET | ORAL | 0 refills | Status: DC

## 2019-07-31 NOTE — Patient Instructions (Addendum)
Muscle Strain  A muscle strain is an injury that occurs when a muscle is stretched beyond its normal length. Usually, a small number of muscle fibers are torn when this happens. There are three types of muscle strains. First-degree strains have the least amount of muscle fiber tearing and the least amount of pain. Second-degree and third-degree strains have more tearing and pain.  Usually, recovery from muscle strain takes 1-2 weeks. Complete healing normally takes 5-6 weeks.  What are the causes?  This condition is caused when a sudden, violent force is placed on a muscle and stretches it too far. This may occur with a fall, lifting, or sports.  What increases the risk?  This condition is more likely to develop in athletes and people who are physically active.  What are the signs or symptoms?  Symptoms of this condition include:  · Pain.  · Bruising.  · Swelling.  · Trouble using the muscle.  How is this diagnosed?  This condition is diagnosed based on a physical exam and your medical history. Tests may also be done, including an X-ray, ultrasound, or MRI.  How is this treated?  This condition is initially treated with PRICE therapy. This therapy involves:  · Protecting the muscle from being injured again.  · Resting the injured muscle.  · Icing the injured muscle.  · Applying pressure (compression) to the injured muscle. This may be done with a splint or elastic bandage.  · Raising (elevating) the injured muscle.  Your health care provider may also recommend medicine for pain.  Follow these instructions at home:  If you have a splint:  · Wear the splint as told by your health care provider. Remove it only as told by your health care provider.  · Loosen the splint if your fingers or toes tingle, become numb, or turn cold and blue.  · Keep the splint clean.  · If the splint is not waterproof:  ? Do not let it get wet.  ? Cover it with a watertight covering when you take a bath or a shower.  Managing pain, stiffness,  and swelling    · If directed, put ice on the injured area.  ? If you have a removable splint, remove it as told by your health care provider.  ? Put ice in a plastic bag.  ? Place a towel between your skin and the bag.  ? Leave the ice on for 20 minutes, 2-3 times a day.  · Move your fingers or toes often to avoid stiffness and to lessen swelling.  · Raise (elevate) the injured area above the level of your heart while you are sitting or lying down.  · Wear an elastic bandage as told by your health care provider. Make sure that it is not too tight.  General instructions  · Take over-the-counter and prescription medicines only as told by your health care provider.  · Restrict your activity and rest the injured muscle as told by your health care provider. Gentle movements may be allowed.  · If physical therapy was prescribed, do exercises as told by your health care provider.  · Do not put pressure on any part of the splint until it is fully hardened. This may take several hours.  · Do not use any products that contain nicotine or tobacco, such as cigarettes and e-cigarettes. These can delay bone healing. If you need help quitting, ask your health care provider.  · Ask your health care provider when it   You have more pain or swelling in the injured area. Get help right away if:  You have numbness or tingling or lose a lot of strength in the injured area. Summary  A muscle strain is an injury that occurs when a muscle is stretched beyond its normal length.  This condition is caused when a sudden, violent force is placed on a muscle and stretches it too far.  This condition is initially treated with PRICE therapy, which involves protecting, resting,  icing, compressing, and elevating.  Gentle movements may be allowed. If physical therapy was prescribed, do exercises as told by your health care provider. This information is not intended to replace advice given to you by your health care provider. Make sure you discuss any questions you have with your health care provider. Document Revised: 12/08/2016 Document Reviewed: 02/02/2016 Elsevier Patient Education  2020 [employer]  Hernia, Adult     A hernia is the bulging of an organ or tissue through a weak spot in the muscles of the abdomen (abdominal wall). Hernias develop most often near the belly button (navel) or the area where the leg meets the lower abdomen (groin). Common types of hernias include:  Incisional hernia. This type bulges through a scar from an abdominal surgery.  Umbilical hernia. This type develops near the navel.  Inguinal hernia. This type develops in the groin or scrotum.  Femoral hernia. This type develops under the groin, in the upper thigh area.  Hiatal hernia. This type occurs when part of the stomach slides above the muscle that separates the abdomen from the chest (diaphragm). What are the causes? This condition may be caused by:  Heavy lifting.  Coughing over a long period of time.  Straining to have a bowel movement. Constipation can lead to straining.  An incision made during an abdominal surgery.  A physical problem that is present at birth (congenital defect).  Being overweight or obese.  Smoking.  Excess fluid in the abdomen.  Undescended testicles in males. What are the signs or symptoms? The main symptom is a skin-colored, rounded bulge in the area of the hernia. However, a bulge may not always be present. It may grow bigger or be more visible when you cough or strain (such as when lifting something heavy). A hernia that can be pushed back into the area (is reducible) rarely causes pain. A hernia that cannot be pushed back into the  area (is incarcerated) may lose its blood supply (become strangulated). A hernia that is incarcerated may cause:  Pain.  Fever.  Nausea and vomiting.  Swelling.  Constipation. How is this diagnosed? A hernia may be diagnosed based on:  Your symptoms and medical history.  A physical exam. Your health care provider may ask you to cough or move in certain ways to see if the hernia becomes visible.  Imaging tests, such as: ? X-rays. ? Ultrasound. ? CT scan. How is this treated? A hernia that is small and painless may not need to be treated. A hernia that is large or painful may be treated with surgery. Inguinal hernias may be treated with surgery to prevent incarceration or strangulation. Strangulated hernias are always treated with surgery because a lack of blood supply to the trapped organ or tissue can cause it to die. Surgery to treat a hernia involves pushing the bulge back into place and repairing the weak area of the muscle or abdominal wall. Follow these instructions at home: Activity  Avoid straining.  Do not lift  anything that is heavier than 10 lb (4.5 kg), or the limit that you are told, until your health care provider says that it is safe.  When lifting heavy objects, lift with your leg muscles, not your back muscles. Preventing constipation  Take actions to prevent constipation. Constipation leads to straining with bowel movements, which can make a hernia worse or cause a hernia repair to break down. Your health care provider may recommend that you: ? Drink enough fluid to keep your urine pale yellow. ? Eat foods that are high in fiber, such as fresh fruits and vegetables, whole grains, and beans. ? Limit foods that are high in fat and processed sugars, such as fried or sweet foods. ? Take an over-the-counter or prescription medicine for constipation. General instructions  When coughing, try to cough gently.  You may try to push the hernia back in place by very  gently pressing on it while lying down. Do not try to force the bulge back in if it will not push in easily.  If you are overweight, work with your health care provider to lose weight safely.  Do not use any products that contain nicotine or tobacco, such as cigarettes and e-cigarettes. If you need help quitting, ask your health care provider.  If you are scheduled for hernia repair, watch your hernia for any changes in shape, size, or color. Tell your health care provider about any changes or new symptoms.  Take over-the-counter and prescription medicines only as told by your health care provider.  Keep all follow-up visits as told by your health care provider. This is important. Contact a health care provider if:  You develop new pain, swelling, or redness around your hernia.  You have signs of constipation, such as: ? Fewer bowel movements in a week than normal. ? Difficulty having a bowel movement. ? Stools that are dry, hard, or larger than normal. Get help right away if:  You have a fever.  You have abdomen pain that gets worse.  You feel nauseous or you vomit.  You cannot push the hernia back in place by very gently pressing on it while lying down. Do not try to force the bulge back in if it will not push in easily.  The hernia: ? Changes in shape, size, or color. ? Feels hard or tender. These symptoms may represent a serious problem that is an emergency. Do not wait to see if the symptoms will go away. Get medical help right away. Call your local emergency services (911 in the [place] Summary  A hernia is the bulging of an organ or tissue through a weak spot in the muscles of the abdomen (abdominal wall).  The main symptom is a skin-colored, rounded lump (bulge) in the hernia area. However, a bulge may not always be present. It may grow bigger or more visible when you cough or strain (such as when having a bowel movement).  A hernia that is small and painless may not  need to be treated. A hernia that is large or painful may be treated with surgery.  Surgery to treat a hernia involves pushing the bulge back into place and repairing the weak part of the abdomen. This information is not intended to replace advice given to you by your health care provider. Make sure you discuss any questions you have with your health care provider. Document Revised: 04/18/2018 Document Reviewed: 09/27/2016 Elsevier Patient Education  2020 [employer] Inc.  Heart-Healthy Eating Plan Many factors influence your  heart (coronary) health, including eating and exercise habits. Coronary risk increases with abnormal blood fat (lipid) levels. Heart-healthy meal planning includes limiting unhealthy fats, increasing healthy fats, and making other diet and lifestyle changes. What is my plan? Your health care provider may recommend that you:  Limit your fat intake to _________% or less of your total calories each day.  Limit your saturated fat intake to _________% or less of your total calories each day.  Limit the amount of cholesterol in your diet to less than _________ mg per day. What are tips for following this plan? Cooking Cook foods using methods other than frying. Baking, boiling, grilling, and broiling are all good options. Other ways to reduce fat include:  Removing the skin from poultry.  Removing all visible fats from meats.  Steaming vegetables in water or broth. Meal planning   At meals, imagine dividing your plate into fourths: ? Fill one-half of your plate with vegetables and green salads. ? Fill one-fourth of your plate with whole grains. ? Fill one-fourth of your plate with lean protein foods.  Eat 4-5 servings of vegetables per day. One serving equals 1 cup raw or cooked vegetable, or 2 cups raw leafy greens.  Eat 4-5 servings of fruit per day. One serving equals 1 medium whole fruit,  cup dried fruit,  cup fresh, frozen, or canned fruit, or  cup 100%  fruit juice.  Eat more foods that contain soluble fiber. Examples include apples, broccoli, carrots, beans, peas, and barley. Aim to get 25-30 g of fiber per day.  Increase your consumption of legumes, nuts, and seeds to 4-5 servings per week. One serving of dried beans or legumes equals  cup cooked, 1 serving of nuts is  cup, and 1 serving of seeds equals 1 tablespoon. Fats  Choose healthy fats more often. Choose monounsaturated and polyunsaturated fats, such as olive and canola oils, flaxseeds, walnuts, almonds, and seeds.  Eat more omega-3 fats. Choose salmon, mackerel, sardines, tuna, flaxseed oil, and ground flaxseeds. Aim to eat fish at least 2 times each week.  Check food labels carefully to identify foods with trans fats or high amounts of saturated fat.  Limit saturated fats. These are found in animal products, such as meats, butter, and cream. Plant sources of saturated fats include palm oil, palm kernel oil, and coconut oil.  Avoid foods with partially hydrogenated oils in them. These contain trans fats. Examples are stick margarine, some tub margarines, cookies, crackers, and other baked goods.  Avoid fried foods. General information  Eat more home-cooked food and less restaurant, buffet, and fast food.  Limit or avoid alcohol.  Limit foods that are high in starch and sugar.  Lose weight if you are overweight. Losing just 5-10% of your body weight can help your overall health and prevent diseases such as diabetes and heart disease.  Monitor your salt (sodium) intake, especially if you have high blood pressure. Talk with your health care provider about your sodium intake.  Try to incorporate more vegetarian meals weekly. What foods can I eat? Fruits All fresh, canned (in natural juice), or frozen fruits. Vegetables Fresh or frozen vegetables (raw, steamed, roasted, or grilled). Green salads. Grains Most grains. Choose whole wheat and whole grains most of the time.  Rice and pasta, including brown rice and pastas made with whole wheat. Meats and other proteins Lean, well-trimmed beef, veal, pork, and lamb. Chicken and [place] without skin. All fish and shellfish. Wild duck, rabbit, pheasant, and venison. Egg  whites or low-cholesterol egg substitutes. Dried beans, peas, lentils, and tofu. Seeds and most nuts. Dairy Low-fat or nonfat cheeses, including ricotta and mozzarella. Skim or 1% milk (liquid, powdered, or evaporated). Buttermilk made with low-fat milk. Nonfat or low-fat yogurt. Fats and oils Non-hydrogenated (trans-free) margarines. Vegetable oils, including soybean, sesame, sunflower, olive, peanut, safflower, corn, canola, and cottonseed. Salad dressings or mayonnaise made with a vegetable oil. Beverages Water (mineral or sparkling). Coffee and tea. Diet carbonated beverages. Sweets and desserts Sherbet, gelatin, and fruit ice. Small amounts of dark chocolate. Limit all sweets and desserts. Seasonings and condiments All seasonings and condiments. The items listed above may not be a complete list of foods and beverages you can eat. Contact a dietitian for more options. What foods are not recommended? Fruits Canned fruit in heavy syrup. Fruit in cream or butter sauce. Fried fruit. Limit coconut. Vegetables Vegetables cooked in cheese, cream, or butter sauce. Fried vegetables. Grains Breads made with saturated or trans fats, oils, or whole milk. Croissants. Sweet rolls. Donuts. High-fat crackers, such as cheese crackers. Meats and other proteins Fatty meats, such as hot dogs, ribs, sausage, bacon, rib-eye roast or steak. High-fat deli meats, such as salami and bologna. Caviar. Domestic duck and goose. Organ meats, such as liver. Dairy Cream, sour cream, cream cheese, and creamed cottage cheese. Whole milk cheeses. Whole or 2% milk (liquid, evaporated, or condensed). Whole buttermilk. Cream sauce or high-fat cheese sauce. Whole-milk yogurt. Fats and  oils Meat fat, or shortening. Cocoa butter, hydrogenated oils, palm oil, coconut oil, palm kernel oil. Solid fats and shortenings, including bacon fat, salt pork, lard, and butter. Nondairy cream substitutes. Salad dressings with cheese or sour cream. Beverages Regular sodas and any drinks with added sugar. Sweets and desserts Frosting. Pudding. Cookies. Cakes. Pies. Milk chocolate or white chocolate. Buttered syrups. Full-fat ice cream or ice cream drinks. The items listed above may not be a complete list of foods and beverages to avoid. Contact a dietitian for more information. Summary  Heart-healthy meal planning includes limiting unhealthy fats, increasing healthy fats, and making other diet and lifestyle changes.  Lose weight if you are overweight. Losing just 5-10% of your body weight can help your overall health and prevent diseases such as diabetes and heart disease.  Focus on eating a balance of foods, including fruits and vegetables, low-fat or nonfat dairy, lean protein, nuts and legumes, whole grains, and heart-healthy oils and fats. This information is not intended to replace advice given to you by your health care provider. Make sure you discuss any questions you have with your health care provider. Document Revised: 02/02/2017 Document Reviewed: 02/02/2017 Elsevier Patient Education  2020 [employer]

## 2019-07-31 NOTE — Progress Notes (Signed)
Subjective:    Patient ID: Peter Hughes, male    DOB: 1955-01-18, 64 y.o.   MRN: [id number]  No chief complaint on file.   HPI Patient was seen today for acute concern.  Pt notes a pinching sensation in R upper thigh/groin after picking up sticks and limbs in the yard a day or so ago. Sensation occurs with certain movements and does not last long.  Patient denies discomfort with having a BM or coughing, bulging in scrotum or groin.  Pt notes a history of "varicose veins" in left scrotum, states left side is slightly> than right.  Pt was concerned he may have a hernia as he is going out of town in the next few days.  Pt going to [place] for a few weeks.  Patient requesting refills on Norvasc and quinapril.  Past Medical History:  Diagnosis Date  . EPIDIDYMITIS 08/03/2009  . HYPERTENSION 08/13/2008    Allergies  Allergen Reactions  . Benadryl [Diphenhydramine Hcl]     agitation  . Diphenhydramine Hcl     Reaction: agitation, a cough green syrup    ROS General: Denies fever, chills, night sweats, changes in weight, changes in appetite HEENT: Denies headaches, ear pain, changes in vision, rhinorrhea, sore throat CV: Denies CP, palpitations, SOB, orthopnea Pulm: Denies SOB, cough, wheezing GI: Denies abdominal pain, nausea, vomiting, diarrhea, constipation GU: Denies dysuria, hematuria, frequency Msk: Denies muscle cramps, joint pains  +pinching sensation in R upper thigh/groin Neuro: Denies weakness, numbness, tingling Skin: Denies rashes, bruising Psych: Denies depression, anxiety, hallucinations    Objective:    Blood pressure (!) 130/76, pulse 80, temperature 97.8 F (36.6 C), temperature source Oral, weight 199 lb (90.3 kg), SpO2 98 %.  Gen. Pleasant, well-nourished, in no distress, normal affect   HEENT: [place]/AT, face symmetric, no scleral icterus, PERRLA, EOMI, nares patent without drainage Lungs: no accessory muscle use, CTAB, no wheezes or rales Cardiovascular: RRR, no  m/r/g, no peripheral edema Abdomen: BS present, soft, NT/ND, no hepatosplenomegaly. GU: Normal external male genitalia.  Left scrotum slightly larger than the right without mass, varicocele, erythema.  No inguinal hernia noted mild TTP of right medial upper thigh without mass, induration, erythema, or rash. Musculoskeletal: No deformities, no cyanosis or clubbing, normal tone Neuro:  A&Ox3, CN II-XII intact, normal gait Skin:  Warm, no lesions/ rash   Wt Readings from Last 3 Encounters:  07/31/19 199 lb (90.3 kg)  08/23/18 205 lb 14.4 oz (93.4 kg)  09/27/17 195 lb 12.8 oz (88.8 kg)    Lab Results  Component Value Date   WBC 8.0 07/30/2017   HGB 15.8 07/30/2017   HCT 46.9 07/30/2017   PLT 321.0 07/30/2017   GLUCOSE 96 07/30/2017   CHOL 144 07/30/2017   TRIG 130.0 07/30/2017   HDL 29.30 (L) 07/30/2017   LDLCALC 89 07/30/2017   ALT 26 05/14/2018   AST 18 05/14/2018   NA 137 07/30/2017   K 3.9 07/30/2017   CL 102 07/30/2017   CREATININE 0.95 07/30/2017   BUN 14 07/30/2017   CO2 26 07/30/2017   TSH 0.77 07/30/2017   PSA 0.15 07/30/2017   HGBA1C 6.0 07/30/2017   MICROALBUR 5.9 (H) 07/30/2017    Assessment/Plan:  Strain of muscle of right groin region -No hernia noted on exam -Discussed supportive care including heat, ice, massage, stretching -Continue to monitor -Given precautions  Essential hypertension  -Controlled -Continue lifestyle modifications -Limited Rx sent to pharmacy for patient as he will be out of town.  For further refills contact PCP - Plan: quinapril (ACCUPRIL) 40 MG tablet, amLODipine (NORVASC) 5 MG tablet  F/u as needed with PCP  Abbe Amsterdam, MD

## 2019-08-06 ENCOUNTER — Encounter: Payer: Self-pay | Admitting: Family Medicine

## 2019-09-09 ENCOUNTER — Encounter: Payer: Self-pay | Admitting: Family Medicine

## 2019-09-09 ENCOUNTER — Other Ambulatory Visit: Payer: Self-pay

## 2019-09-09 ENCOUNTER — Ambulatory Visit (INDEPENDENT_AMBULATORY_CARE_PROVIDER_SITE_OTHER): Payer: 59 | Admitting: Family Medicine

## 2019-09-09 VITALS — BP 110/60 | HR 60 | Temp 98.1°F | Ht 70.0 in | Wt 194.0 lb

## 2019-09-09 DIAGNOSIS — I214 Non-ST elevation (NSTEMI) myocardial infarction: Secondary | ICD-10-CM | POA: Diagnosis not present

## 2019-09-09 DIAGNOSIS — Z0000 Encounter for general adult medical examination without abnormal findings: Secondary | ICD-10-CM

## 2019-09-09 DIAGNOSIS — E1165 Type 2 diabetes mellitus with hyperglycemia: Secondary | ICD-10-CM

## 2019-09-09 NOTE — Patient Instructions (Addendum)
Preventive Care 41-62 Years Old, Male Preventive care refers to lifestyle choices and visits with your health care provider that can promote health and wellness. This includes:  A yearly physical exam. This is also called an annual well check.  Regular dental and eye exams.  Immunizations.  Screening for certain conditions.  Healthy lifestyle choices, such as eating a healthy diet, getting regular exercise, not using drugs or products that contain nicotine and tobacco, and limiting alcohol use. What can I expect for my preventive care visit? Physical exam Your health care provider will check:  Height and weight. These may be used to calculate body mass index (BMI), which is a measurement that tells if you are at a healthy weight.  Heart rate and blood pressure.  Your skin for abnormal spots. Counseling Your health care provider may ask you questions about:  Alcohol, tobacco, and drug use.  Emotional well-being.  Home and relationship well-being.  Sexual activity.  Eating habits.  Work and work Statistician. What immunizations do I need?  Influenza (flu) vaccine  This is recommended every year. Tetanus, diphtheria, and pertussis (Tdap) vaccine  You may need a Td booster every 10 years. Varicella (chickenpox) vaccine  You may need this vaccine if you have not already been vaccinated. Zoster (shingles) vaccine  You may need this after age 62. Measles, mumps, and rubella (MMR) vaccine  You may need at least one dose of MMR if you were born in 1955 or later. You may also need a second dose. Pneumococcal conjugate (PCV13) vaccine  You may need this if you have certain conditions and were not previously vaccinated. Pneumococcal polysaccharide (PPSV23) vaccine  You may need one or two doses if you smoke cigarettes or if you have certain conditions. Meningococcal conjugate (MenACWY) vaccine  You may need this if you have certain conditions. Hepatitis A  vaccine  You may need this if you have certain conditions or if you travel or work in places where you may be exposed to hepatitis A. Hepatitis B vaccine  You may need this if you have certain conditions or if you travel or work in places where you may be exposed to hepatitis B. Haemophilus influenzae type b (Hib) vaccine  You may need this if you have certain risk factors. Human papillomavirus (HPV) vaccine  If recommended by your health care provider, you may need three doses over 6 months. You may receive vaccines as individual doses or as more than one vaccine together in one shot (combination vaccines). Talk with your health care provider about the risks and benefits of combination vaccines. What tests do I need? Blood tests  Lipid and cholesterol levels. These may be checked every 5 years, or more frequently if you are over 62 years old.  Hepatitis C test.  Hepatitis B test. Screening  Lung cancer screening. You may have this screening every year starting at age 62 if you have a 30-pack-year history of smoking and currently smoke or have quit within the past 15 years.  Prostate cancer screening. Recommendations will vary depending on your family history and other risks.  Colorectal cancer screening. All adults should have this screening starting at age 62 and continuing until age 2. Your health care provider may recommend screening at age 14 if you are at increased risk. You will have tests every 1-10 years, depending on your results and the type of screening test.  Diabetes screening. This is done by checking your blood sugar (glucose) after you have not eaten  for a while (fasting). You may have this done every 1-3 years.  Sexually transmitted disease (STD) testing. Follow these instructions at home: Eating and drinking  Eat a diet that includes fresh fruits and vegetables, whole grains, lean protein, and low-fat dairy products.  Take vitamin and mineral supplements as  recommended by your health care provider.  Do not drink alcohol if your health care provider tells you not to drink.  If you drink alcohol: ? Limit how much you have to 0-2 drinks a day. ? Be aware of how much alcohol is in your drink. In the [place] one drink equals one 12 oz bottle of beer (355 mL), one 5 oz glass of wine (148 mL), or one 1 oz glass of hard liquor (44 mL). Lifestyle  Take daily care of your teeth and gums.  Stay active. Exercise for at least 30 minutes on 5 or more days each week.  Do not use any products that contain nicotine or tobacco, such as cigarettes, e-cigarettes, and chewing tobacco. If you need help quitting, ask your health care provider.  If you are sexually active, practice safe sex. Use a condom or other form of protection to prevent STIs (sexually transmitted infections).  Talk with your health care provider about taking a low-dose aspirin every day starting at age 62. What's next?  Go to your health care provider once a year for a well check visit.  Ask your health care provider how often you should have your eyes and teeth checked.  Stay up to date on all vaccines. This information is not intended to replace advice given to you by your health care provider. Make sure you discuss any questions you have with your health care provider. Document Revised: 12/20/2017 Document Reviewed: 12/20/2017 Elsevier Patient Education  2020 [employer].

## 2019-09-09 NOTE — Progress Notes (Signed)
Established Patient Office Visit  Subjective:  Patient ID: Peter Hughes, male    DOB: 07-Aug-1955  Age: 64 y.o. MRN: [id number]  CC:  Chief Complaint  Patient presents with  . Annual Exam    HPI Peter Hughes presents for physical examination.  On Memorial Day weekend he developed some chest pain.  He went to [facility] in [place].  He had non-ST elevation MI.  He ended up with stent and mid LAD lesion and has done well since that time.  He was placed on high-dose atorvastatin along with Brilinta and baby aspirin.  No recent chest pain.  He has close follow-up scheduled with cardiology.  He states he has not had a lipids since starting the atorvastatin.  He has had Covid vaccine.  Tetanus up-to-date.  He declines pneumonia vaccine and flu vaccine.  He has had shingles vaccine.  He does have history of hyperglycemia and basically reversed his diabetes with lifestyle changes.  No recent A1c.  No recent polyuria or polydipsia  Family history reviewed with no changes.  Social history-never smoked.  No alcohol use.  Past Medical History:  Diagnosis Date  . EPIDIDYMITIS 08/03/2009  . HYPERTENSION 08/13/2008    Past Surgical History:  Procedure Laterality Date  . HAND SURGERY  2005   crush injury  . lipotripsy  2006   Kidney stone    Family History  Problem Relation Age of Onset  . Alcohol abuse Other   . Cancer Other        breast  . Diabetes Other   . Hypertension Other     Social History   Socioeconomic History  . Marital status: Married    Spouse name: Not on file  . Number of children: Not on file  . Years of education: Not on file  . Highest education level: Not on file  Occupational History  . Not on file  Tobacco Use  . Smoking status: Never Smoker  . Smokeless tobacco: Never Used  Vaping Use  . Vaping Use: Never used  Substance and Sexual Activity  . Alcohol use: No    Alcohol/week: 0.0 standard drinks  . Drug use: No  . Sexual activity:  Not on file  Other Topics Concern  . Not on file  Social History Narrative  . Not on file   Social Determinants of Health   Financial Resource Strain:   . Difficulty of Paying Living Expenses: Not on file  Food Insecurity:   . Worried About Programme researcher, broadcasting/film/video in the Last Year: Not on file  . Ran Out of Food in the Last Year: Not on file  Transportation Needs:   . Lack of Transportation (Medical): Not on file  . Lack of Transportation (Non-Medical): Not on file  Physical Activity:   . Days of Exercise per Week: Not on file  . Minutes of Exercise per Session: Not on file  Stress:   . Feeling of Stress : Not on file  Social Connections:   . Frequency of Communication with Friends and Family: Not on file  . Frequency of Social Gatherings with Friends and Family: Not on file  . Attends Religious Services: Not on file  . Active Member of Clubs or Organizations: Not on file  . Attends Banker Meetings: Not on file  . Marital Status: Not on file  Intimate Partner Violence:   . Fear of Current or Ex-Partner: Not on file  . Emotionally Abused: Not on  file  . Physically Abused: Not on file  . Sexually Abused: Not on file    Outpatient Medications Prior to Visit  Medication Sig Dispense Refill  . amLODipine (NORVASC) 5 MG tablet Take one tablet by mouth daily. 90 tablet 0  . ASPIRIN LOW DOSE 81 MG EC tablet Take 81 mg by mouth daily.    Marland Kitchen atorvastatin (LIPITOR) 40 MG tablet Take 40 mg by mouth at bedtime.    Marland Kitchen BRILINTA 90 MG TABS tablet Take 90 mg by mouth 2 (two) times daily.    . metoprolol succinate (TOPROL-XL) 25 MG 24 hr tablet Take 25 mg by mouth daily.    . nitroGLYCERIN (NITROSTAT) 0.4 MG SL tablet Place under the tongue.    . quinapril (ACCUPRIL) 40 MG tablet Take 1 tablet (40 mg total) by mouth daily. 90 tablet 0   No facility-administered medications prior to visit.    Allergies  Allergen Reactions  . Benadryl [Diphenhydramine Hcl]     agitation  .  Diphenhydramine Hcl     Reaction: agitation, a cough green syrup    ROS Review of Systems  Constitutional: Negative for activity change, appetite change, fatigue and fever.  HENT: Negative for congestion, ear pain and trouble swallowing.   Eyes: Negative for pain and visual disturbance.  Respiratory: Negative for cough, shortness of breath and wheezing.   Cardiovascular: Negative for chest pain and palpitations.  Gastrointestinal: Negative for abdominal distention, abdominal pain, blood in stool, constipation, diarrhea, nausea, rectal pain and vomiting.  Endocrine: Negative for polydipsia and polyuria.  Genitourinary: Negative for dysuria, hematuria and testicular pain.  Musculoskeletal: Negative for arthralgias and joint swelling.  Skin: Negative for rash.  Neurological: Negative for dizziness, syncope and headaches.  Hematological: Negative for adenopathy.  Psychiatric/Behavioral: Negative for confusion and dysphoric mood.      Objective:    Physical Exam Constitutional:      General: He is not in acute distress.    Appearance: He is well-developed.  HENT:     Head: Normocephalic and atraumatic.     Right Ear: External ear normal.     Left Ear: External ear normal.  Eyes:     Conjunctiva/sclera: Conjunctivae normal.     Pupils: Pupils are equal, round, and reactive to light.  Neck:     Thyroid: No thyromegaly.  Cardiovascular:     Rate and Rhythm: Normal rate and regular rhythm.     Heart sounds: Normal heart sounds. No murmur heard.   Pulmonary:     Effort: No respiratory distress.     Breath sounds: No wheezing or rales.  Abdominal:     General: Bowel sounds are normal. There is no distension.     Palpations: Abdomen is soft. There is no mass.     Tenderness: There is no abdominal tenderness. There is no guarding or rebound.  Musculoskeletal:     Cervical back: Normal range of motion and neck supple.     Right lower leg: No edema.     Left lower leg: No edema.    Lymphadenopathy:     Cervical: No cervical adenopathy.  Skin:    Findings: No rash.  Neurological:     Mental Status: He is alert and oriented to person, place, and time.     Cranial Nerves: No cranial nerve deficit.     Deep Tendon Reflexes: Reflexes normal.     BP 110/60   Pulse 60   Temp 98.1 F (36.7 C) (Oral)  Ht 5\' 10"  (1.778 m)   Wt 194 lb (88 kg)   SpO2 98%   BMI 27.84 kg/m  Wt Readings from Last 3 Encounters:  09/09/19 194 lb (88 kg)  07/31/19 199 lb (90.3 kg)  08/23/18 205 lb 14.4 oz (93.4 kg)     Health Maintenance Due  Topic Date Due  . FOOT EXAM  Never done  . OPHTHALMOLOGY EXAM  Never done  . INFLUENZA VACCINE  08/10/2019    There are no preventive care reminders to display for this patient.  Lab Results  Component Value Date   TSH 0.77 07/30/2017   Lab Results  Component Value Date   WBC 8.0 07/30/2017   HGB 15.8 07/30/2017   HCT 46.9 07/30/2017   MCV 88.8 07/30/2017   PLT 321.0 07/30/2017   Lab Results  Component Value Date   NA 137 07/30/2017   K 3.9 07/30/2017   CO2 26 07/30/2017   GLUCOSE 96 07/30/2017   BUN 14 07/30/2017   CREATININE 0.95 07/30/2017   BILITOT 0.5 05/14/2018   ALKPHOS 90 02/04/2013   AST 18 05/14/2018   ALT 26 05/14/2018   PROT 7.1 05/14/2018   ALBUMIN 3.9 02/04/2013   CALCIUM 9.2 07/30/2017   GFR 85.93 07/30/2017   Lab Results  Component Value Date   CHOL 144 07/30/2017   Lab Results  Component Value Date   HDL 29.30 (L) 07/30/2017   Lab Results  Component Value Date   LDLCALC 89 07/30/2017   Lab Results  Component Value Date   TRIG 130.0 07/30/2017   Lab Results  Component Value Date   CHOLHDL 5 07/30/2017   Lab Results  Component Value Date   HGBA1C 6.0 07/30/2017      Assessment & Plan:   #1 physical exam.  We discussed several health maintenance issues as below  -We have advised/recommended flu vaccine and Pneumovax and he declines.  We explained rationale for consideration for  Pneumovax prior to age 19 in patients with diabetes history and history of CAD -Shingles vaccine already given -Covid vaccine already given -Colonoscopy up-to-date with next due 2025 -Check labs including PSA.  Will include A1c with his prior history of hyperglycemia -Check hepatitis C antibody, though low risk  #2 recent NSTEMI-stable symptomatically at this time  -Recheck lipid and hepatic panel -Continue close follow-up with cardiology  No orders of the defined types were placed in this encounter.   Follow-up: No follow-ups on file.    2026, MD

## 2019-09-10 LAB — CBC WITH DIFFERENTIAL/PLATELET
Absolute Monocytes: 911 cells/uL (ref 200–950)
Basophils Absolute: 101 cells/uL (ref 0–200)
Basophils Relative: 1.1 %
Eosinophils Absolute: 230 cells/uL (ref 15–500)
Eosinophils Relative: 2.5 %
HCT: 45.3 % (ref 38.5–50.0)
Hemoglobin: 15.2 g/dL (ref 13.2–17.1)
Lymphs Abs: 2484 cells/uL (ref 850–3900)
MCH: 30.2 pg (ref 27.0–33.0)
MCHC: 33.6 g/dL (ref 32.0–36.0)
MCV: 89.9 fL (ref 80.0–100.0)
MPV: 9.8 fL (ref 7.5–12.5)
Monocytes Relative: 9.9 %
Neutro Abs: 5474 cells/uL (ref 1500–7800)
Neutrophils Relative %: 59.5 %
Platelets: 329 10*3/uL (ref 140–400)
RBC: 5.04 10*6/uL (ref 4.20–5.80)
RDW: 13.1 % (ref 11.0–15.0)
Total Lymphocyte: 27 %
WBC: 9.2 10*3/uL (ref 3.8–10.8)

## 2019-09-10 LAB — BASIC METABOLIC PANEL WITH GFR
BUN: 14 mg/dL (ref 7–25)
CO2: 23 mmol/L (ref 20–32)
Calcium: 9.2 mg/dL (ref 8.6–10.3)
Chloride: 104 mmol/L (ref 98–110)
Creat: 0.89 mg/dL (ref 0.70–1.25)
Glucose, Bld: 110 mg/dL — ABNORMAL HIGH (ref 65–99)
Potassium: 4.3 mmol/L (ref 3.5–5.3)
Sodium: 139 mmol/L (ref 135–146)

## 2019-09-10 LAB — HEMOGLOBIN A1C
Hgb A1c MFr Bld: 6.4 % of total Hgb — ABNORMAL HIGH (ref <5.7)
Mean Plasma Glucose: 137 (calc)
eAG (mmol/L): 7.6 (calc)

## 2019-09-10 LAB — LIPID PANEL
Cholesterol: 81 mg/dL (ref <200)
HDL: 32 mg/dL — ABNORMAL LOW (ref >=40)
LDL Cholesterol (Calc): 33 mg/dL (calc)
Non-HDL Cholesterol (Calc): 49 mg/dL (calc) (ref <130)
Total CHOL/HDL Ratio: 2.5 (calc) (ref <5.0)
Triglycerides: 78 mg/dL (ref <150)

## 2019-09-10 LAB — HEPATIC FUNCTION PANEL
AG Ratio: 1.7 (calc) (ref 1.0–2.5)
ALT: 47 U/L — ABNORMAL HIGH (ref 9–46)
AST: 25 U/L (ref 10–35)
Albumin: 4.2 g/dL (ref 3.6–5.1)
Alkaline phosphatase (APISO): 94 U/L (ref 35–144)
Bilirubin, Direct: 0.2 mg/dL (ref 0.0–0.2)
Globulin: 2.5 g/dL (ref 1.9–3.7)
Indirect Bilirubin: 0.6 mg/dL (ref 0.2–1.2)
Total Bilirubin: 0.8 mg/dL (ref 0.2–1.2)
Total Protein: 6.7 g/dL (ref 6.1–8.1)

## 2019-09-10 LAB — HEPATITIS C ANTIBODY
Hepatitis C Ab: NONREACTIVE
SIGNAL TO CUT-OFF: 0

## 2019-09-10 LAB — PSA: PSA: 0.1 ng/mL (ref <=4.0)

## 2019-09-10 LAB — TSH: TSH: 0.71 mIU/L (ref 0.40–4.50)

## 2019-09-11 ENCOUNTER — Telehealth: Payer: Self-pay | Admitting: Family Medicine

## 2019-09-11 NOTE — Telephone Encounter (Signed)
Pt returning Kendra's call about labs

## 2019-09-12 NOTE — Telephone Encounter (Signed)
Lab results given. Please see lab result note

## 2019-11-04 ENCOUNTER — Encounter: Payer: Self-pay | Admitting: Podiatry

## 2019-11-04 ENCOUNTER — Other Ambulatory Visit: Payer: Self-pay

## 2019-11-04 ENCOUNTER — Ambulatory Visit (INDEPENDENT_AMBULATORY_CARE_PROVIDER_SITE_OTHER): Payer: 59 | Admitting: Podiatry

## 2019-11-04 DIAGNOSIS — B351 Tinea unguium: Secondary | ICD-10-CM | POA: Diagnosis not present

## 2019-11-04 DIAGNOSIS — M79676 Pain in unspecified toe(s): Secondary | ICD-10-CM | POA: Diagnosis not present

## 2019-11-04 NOTE — Progress Notes (Signed)
He presents today chief complaint of painfully elongated toenails 1 through 5 bilaterally.  Objective: Pulses are palpable.  Nails are thick yellow dystrophic clinically mycotic painful palpation as well as debridement.  Assessment: Pain in limb secondary to onychomycosis.  Plan: Debridement of toenails 1 through 5 bilateral cover service secondary to pain.

## 2019-11-29 ENCOUNTER — Other Ambulatory Visit: Payer: Self-pay | Admitting: Family Medicine

## 2019-11-29 DIAGNOSIS — I10 Essential (primary) hypertension: Secondary | ICD-10-CM

## 2019-12-01 NOTE — Telephone Encounter (Signed)
Refill both for 6 months. 

## 2019-12-01 NOTE — Telephone Encounter (Signed)
Pt was last seen in the office on 09/09/2019 and last refill was done on 07/31/2019, This Rx was described by Dr Salomon Fick. Please advise if ok to send refill to pt pharmacy

## 2020-01-14 ENCOUNTER — Encounter: Payer: Self-pay | Admitting: Family Medicine

## 2020-01-14 ENCOUNTER — Telehealth (INDEPENDENT_AMBULATORY_CARE_PROVIDER_SITE_OTHER): Payer: 59 | Admitting: Family Medicine

## 2020-01-14 VITALS — Temp 99.3°F | Ht 70.0 in | Wt 196.0 lb

## 2020-01-14 DIAGNOSIS — B349 Viral infection, unspecified: Secondary | ICD-10-CM

## 2020-01-14 NOTE — Progress Notes (Signed)
   Subjective:    Patient ID: Peter Hughes, male    DOB: [birth year], 65 y.o.   MRN: [id number]  HPI Virtual Visit via Telephone Note  I connected with the patient on 01/14/20 at  8:00 AM EST by telephone and verified that I am speaking with the correct person using two identifiers.   I discussed the limitations, risks, security and privacy concerns of performing an evaluation and management service by telephone and the availability of in person appointments. I also discussed with the patient that there may be a patient responsible charge related to this service. The patient expressed understanding and agreed to proceed.  Location patient: home Location provider: work or home office Participants present for the call: patient, provider Patient did not have a visit in the prior 7 days to address this/these issue(s).   History of Present Illness: Here for 3 days of headache, PND, a ST, a dry cough, fever to 100.1 degrees, and diarrhea. He had some mild chest pain one day but none since then. No SOB. No nausea or vomiting. He has been staying at home and trying to keep to himself. Drinking fluids and taking Claritin. He has had both Pfizer vaccines but not the booster. He wants to get his cardiologist's opinion about the booster first.    Observations/Objective: Patient sounds cheerful and well on the phone. I do not appreciate any SOB. Speech and thought processing are grossly intact. Patient reported vitals:  Assessment and Plan: Viral illness, very possibly due to a Covid-19 infection. He will continue to quarantine and he can take Tylenol and drink fluids. I recommended he get tested for the Covid virus, and he agreed. Recheck as needed.  Gershon Crane, MD   Follow Up Instructions:     [phone] [phone] 11-20 [id number] 21-30 I did not refer this patient for an OV in the next 24 hours for this/these issue(s).  I discussed the assessment and treatment plan with the patient. The patient  was provided an opportunity to ask questions and all were answered. The patient agreed with the plan and demonstrated an understanding of the instructions.   The patient was advised to call back or seek an in-person evaluation if the symptoms worsen or if the condition fails to improve as anticipated.  I provided 13 minutes of non-face-to-face time during this encounter.   Gershon Crane, MD    Review of Systems     Objective:   Physical Exam        Assessment & Plan:

## 2020-02-05 ENCOUNTER — Ambulatory Visit: Payer: 59 | Admitting: Podiatry

## 2020-03-12 ENCOUNTER — Other Ambulatory Visit: Payer: Self-pay

## 2020-03-12 ENCOUNTER — Ambulatory Visit: Payer: 59 | Admitting: Family Medicine

## 2020-03-12 ENCOUNTER — Encounter: Payer: Self-pay | Admitting: Family Medicine

## 2020-03-12 VITALS — BP 120/70 | HR 58 | Ht 70.0 in | Wt 196.0 lb

## 2020-03-12 DIAGNOSIS — E1165 Type 2 diabetes mellitus with hyperglycemia: Secondary | ICD-10-CM | POA: Diagnosis not present

## 2020-03-12 DIAGNOSIS — I214 Non-ST elevation (NSTEMI) myocardial infarction: Secondary | ICD-10-CM | POA: Diagnosis not present

## 2020-03-12 DIAGNOSIS — I10 Essential (primary) hypertension: Secondary | ICD-10-CM | POA: Diagnosis not present

## 2020-03-12 LAB — POCT GLYCOSYLATED HEMOGLOBIN (HGB A1C): Hemoglobin A1C: 6.6 % — AB (ref 4.0–5.6)

## 2020-03-12 NOTE — Patient Instructions (Signed)
Diabetes Mellitus and Nutrition, Adult When you have diabetes, or diabetes mellitus, it is very important to have healthy eating habits because your blood sugar (glucose) levels are greatly affected by what you eat and drink. Eating healthy foods in the right amounts, at about the same times every day, can help you:  Control your blood glucose.  Lower your risk of heart disease.  Improve your blood pressure.  Reach or maintain a healthy weight. What can affect my meal plan? Every person with diabetes is different, and each person has different needs for a meal plan. Your health care provider may recommend that you work with a dietitian to make a meal plan that is best for you. Your meal plan may vary depending on factors such as:  The calories you need.  The medicines you take.  Your weight.  Your blood glucose, blood pressure, and cholesterol levels.  Your activity level.  Other health conditions you have, such as heart or kidney disease. How do carbohydrates affect me? Carbohydrates, also called carbs, affect your blood glucose level more than any other type of food. Eating carbs naturally raises the amount of glucose in your blood. Carb counting is a method for keeping track of how many carbs you eat. Counting carbs is important to keep your blood glucose at a healthy level, especially if you use insulin or take certain oral diabetes medicines. It is important to know how many carbs you can safely have in each meal. This is different for every person. Your dietitian can help you calculate how many carbs you should have at each meal and for each snack. How does alcohol affect me? Alcohol can cause a sudden decrease in blood glucose (hypoglycemia), especially if you use insulin or take certain oral diabetes medicines. Hypoglycemia can be a life-threatening condition. Symptoms of hypoglycemia, such as sleepiness, dizziness, and confusion, are similar to symptoms of having too much  alcohol.  Do not drink alcohol if: ? Your health care provider tells you not to drink. ? You are pregnant, may be pregnant, or are planning to become pregnant.  If you drink alcohol: ? Do not drink on an empty stomach. ? Limit how much you use to:  0-1 drink a day for women.  0-2 drinks a day for men. ? Be aware of how much alcohol is in your drink. In the [place] one drink equals one 12 oz bottle of beer (355 mL), one 5 oz glass of wine (148 mL), or one 1 oz glass of hard liquor (44 mL). ? Keep yourself hydrated with water, diet soda, or unsweetened iced tea.  Keep in mind that regular soda, juice, and other mixers may contain a lot of sugar and must be counted as carbs. What are tips for following this plan? Reading food labels  Start by checking the serving size on the "Nutrition Facts" label of packaged foods and drinks. The amount of calories, carbs, fats, and other nutrients listed on the label is based on one serving of the item. Many items contain more than one serving per package.  Check the total grams (g) of carbs in one serving. You can calculate the number of servings of carbs in one serving by dividing the total carbs by 15. For example, if a food has 30 g of total carbs per serving, it would be equal to 2 servings of carbs.  Check the number of grams (g) of saturated fats and trans fats in one serving. Choose foods that have   a low amount or none of these fats.  Check the number of milligrams (mg) of salt (sodium) in one serving. Most people should limit total sodium intake to less than 2,300 mg per day.  Always check the nutrition information of foods labeled as "low-fat" or "nonfat." These foods may be higher in added sugar or refined carbs and should be avoided.  Talk to your dietitian to identify your daily goals for nutrients listed on the label. Shopping  Avoid buying canned, pre-made, or processed foods. These foods tend to be high in fat, sodium, and added  sugar.  Shop around the outside edge of the grocery store. This is where you will most often find fresh fruits and vegetables, bulk grains, fresh meats, and fresh dairy. Cooking  Use low-heat cooking methods, such as baking, instead of high-heat cooking methods like deep frying.  Cook using healthy oils, such as olive, canola, or sunflower oil.  Avoid cooking with butter, cream, or high-fat meats. Meal planning  Eat meals and snacks regularly, preferably at the same times every day. Avoid going long periods of time without eating.  Eat foods that are high in fiber, such as fresh fruits, vegetables, beans, and whole grains. Talk with your dietitian about how many servings of carbs you can eat at each meal.  Eat 4-6 oz (112-168 g) of lean protein each day, such as lean meat, chicken, fish, eggs, or tofu. One ounce (oz) of lean protein is equal to: ? 1 oz (28 g) of meat, chicken, or fish. ? 1 egg. ?  cup (62 g) of tofu.  Eat some foods each day that contain healthy fats, such as avocado, nuts, seeds, and fish.   What foods should I eat? Fruits Berries. Apples. Oranges. Peaches. Apricots. Plums. Grapes. Mango. Papaya. Pomegranate. Kiwi. Cherries. Vegetables Lettuce. Spinach. Leafy greens, including kale, chard, collard greens, and mustard greens. Beets. Cauliflower. Cabbage. Broccoli. Carrots. Green beans. Tomatoes. Peppers. Onions. Cucumbers. Brussels sprouts. Grains Whole grains, such as whole-wheat or whole-grain bread, crackers, tortillas, cereal, and pasta. Unsweetened oatmeal. Quinoa. Brown or wild rice. Meats and other proteins Seafood. Poultry without skin. Lean cuts of poultry and beef. Tofu. Nuts. Seeds. Dairy Low-fat or fat-free dairy products such as milk, yogurt, and cheese. The items listed above may not be a complete list of foods and beverages you can eat. Contact a dietitian for more information. What foods should I avoid? Fruits Fruits canned with  syrup. Vegetables Canned vegetables. Frozen vegetables with butter or cream sauce. Grains Refined white flour and flour products such as bread, pasta, snack foods, and cereals. Avoid all processed foods. Meats and other proteins Fatty cuts of meat. Poultry with skin. Breaded or fried meats. Processed meat. Avoid saturated fats. Dairy Full-fat yogurt, cheese, or milk. Beverages Sweetened drinks, such as soda or iced tea. The items listed above may not be a complete list of foods and beverages you should avoid. Contact a dietitian for more information. Questions to ask a health care provider  Do I need to meet with a diabetes educator?  Do I need to meet with a dietitian?  What number can I call if I have questions?  When are the best times to check my blood glucose? Where to find more information:  American Diabetes Association: diabetes.org  Academy of Nutrition and Dietetics: www.eatright.org  National Institute of Diabetes and Digestive and Kidney Diseases: www.niddk.nih.gov  Association of Diabetes Care and Education Specialists: www.diabeteseducator.org Summary  It is important to have healthy eating   habits because your blood sugar (glucose) levels are greatly affected by what you eat and drink.  A healthy meal plan will help you control your blood glucose and maintain a healthy lifestyle.  Your health care provider may recommend that you work with a dietitian to make a meal plan that is best for you.  Keep in mind that carbohydrates (carbs) and alcohol have immediate effects on your blood glucose levels. It is important to count carbs and to use alcohol carefully. This information is not intended to replace advice given to you by your health care provider. Make sure you discuss any questions you have with your health care provider. Document Revised: 12/03/2018 Document Reviewed: 12/03/2018 Elsevier Patient Education  2021 Elsevier Inc.  

## 2020-03-12 NOTE — Progress Notes (Signed)
Established Patient Office Visit  Subjective:  Patient ID: Peter Hughes, male    DOB: 08-08-1955  Age: 65 y.o. MRN: [id number]  CC:  Chief Complaint  Patient presents with  . Diabetes    HPI WISE FEES presents for medical follow-up.  He has history of CAD, hypertension, dyslipidemia, hyperglycemia.  Not monitoring sugars regularly.  No polyuria or polydipsia.  He remains on amlodipine, aspirin, Lipitor, Toprol, Accupril, Brilinta.  No recent chest pains.  Tries to follow a low glycemic diet.  He has a brother with type 2 diabetes.  His parents did not have diabetes.  He eats oatmeal usually for breakfast.  He is tries to reduce starchy and high glycemic foods.  Blood pressures been stable.  No headaches or dizziness.  Past Medical History:  Diagnosis Date  . EPIDIDYMITIS 08/03/2009  . HYPERTENSION 08/13/2008    Past Surgical History:  Procedure Laterality Date  . HAND SURGERY  2005   crush injury  . lipotripsy  2006   Kidney stone    Family History  Problem Relation Age of Onset  . Alcohol abuse Other   . Cancer Other        breast  . Diabetes Other   . Hypertension Other     Social History   Socioeconomic History  . Marital status: Married    Spouse name: Not on file  . Number of children: Not on file  . Years of education: Not on file  . Highest education level: Not on file  Occupational History  . Not on file  Tobacco Use  . Smoking status: Never Smoker  . Smokeless tobacco: Never Used  Vaping Use  . Vaping Use: Never used  Substance and Sexual Activity  . Alcohol use: No    Alcohol/week: 0.0 standard drinks  . Drug use: No  . Sexual activity: Not on file  Other Topics Concern  . Not on file  Social History Narrative  . Not on file   Social Determinants of Health   Financial Resource Strain: Not on file  Food Insecurity: Not on file  Transportation Needs: Not on file  Physical Activity: Not on file  Stress: Not on file  Social  Connections: Not on file  Intimate Partner Violence: Not on file    Outpatient Medications Prior to Visit  Medication Sig Dispense Refill  . amLODipine (NORVASC) 5 MG tablet TAKE 1 TABLET BY MOUTH EVERY DAY 30 tablet 6  . ASPIRIN LOW DOSE 81 MG EC tablet Take 81 mg by mouth daily.    Marland Kitchen atorvastatin (LIPITOR) 40 MG tablet Take 40 mg by mouth at bedtime.    Marland Kitchen BRILINTA 90 MG TABS tablet Take 90 mg by mouth 2 (two) times daily.    . metoprolol succinate (TOPROL-XL) 25 MG 24 hr tablet Take 25 mg by mouth daily.    . nitroGLYCERIN (NITROSTAT) 0.4 MG SL tablet Place under the tongue. (Patient not taking: Reported on 01/14/2020)    . quinapril (ACCUPRIL) 40 MG tablet TAKE 1 TABLET BY MOUTH EVERY DAY 30 tablet 6   No facility-administered medications prior to visit.    Allergies  Allergen Reactions  . Benadryl [Diphenhydramine Hcl]     agitation  . Diphenhydramine Hcl     Reaction: agitation, a cough green syrup    ROS Review of Systems  Constitutional: Negative for fatigue and unexpected weight change.  Eyes: Negative for visual disturbance.  Respiratory: Negative for cough, chest tightness and shortness of  breath.   Cardiovascular: Negative for chest pain, palpitations and leg swelling.  Endocrine: Negative for polydipsia and polyuria.  Neurological: Negative for dizziness, syncope, weakness, light-headedness and headaches.      Objective:    Physical Exam Constitutional:      Appearance: He is well-developed and well-nourished.  HENT:     Right Ear: External ear normal.     Left Ear: External ear normal.     Mouth/Throat:     Mouth: Oropharynx is clear and moist.  Eyes:     Pupils: Pupils are equal, round, and reactive to light.  Neck:     Thyroid: No thyromegaly.  Cardiovascular:     Rate and Rhythm: Normal rate and regular rhythm.  Pulmonary:     Effort: Pulmonary effort is normal. No respiratory distress.     Breath sounds: Normal breath sounds. No wheezing or rales.   Musculoskeletal:        General: No edema.     Cervical back: Neck supple.     Right lower leg: No edema.     Left lower leg: No edema.  Neurological:     Mental Status: He is alert and oriented to person, place, and time.     BP 120/70   Pulse (!) 58   Ht 5\' 10"  (1.778 m)   Wt 196 lb (88.9 kg)   SpO2 97%   BMI 28.12 kg/m  Wt Readings from Last 3 Encounters:  03/12/20 196 lb (88.9 kg)  01/14/20 196 lb (88.9 kg)  09/09/19 194 lb (88 kg)     Health Maintenance Due  Topic Date Due  . FOOT EXAM  Never done  . OPHTHALMOLOGY EXAM  Never done  . INFLUENZA VACCINE  Never done  . COVID-19 Vaccine (3 - Booster for Pfizer series) 10/21/2019    There are no preventive care reminders to display for this patient.  Lab Results  Component Value Date   TSH 0.71 09/09/2019   Lab Results  Component Value Date   WBC 9.2 09/09/2019   HGB 15.2 09/09/2019   HCT 45.3 09/09/2019   MCV 89.9 09/09/2019   PLT 329 09/09/2019   Lab Results  Component Value Date   NA 139 09/09/2019   K 4.3 09/09/2019   CO2 23 09/09/2019   GLUCOSE 110 (H) 09/09/2019   BUN 14 09/09/2019   CREATININE 0.89 09/09/2019   BILITOT 0.8 09/09/2019   ALKPHOS 90 02/04/2013   AST 25 09/09/2019   ALT 47 (H) 09/09/2019   PROT 6.7 09/09/2019   ALBUMIN 3.9 02/04/2013   CALCIUM 9.2 09/09/2019   GFR 85.93 07/30/2017   Lab Results  Component Value Date   CHOL 81 09/09/2019   Lab Results  Component Value Date   HDL 32 (L) 09/09/2019   Lab Results  Component Value Date   LDLCALC 33 09/09/2019   Lab Results  Component Value Date   TRIG 78 09/09/2019   Lab Results  Component Value Date   CHOLHDL 2.5 09/09/2019   Lab Results  Component Value Date   HGBA1C 6.6 (A) 03/12/2020      Assessment & Plan:   Problem List Items Addressed This Visit      Unprioritized   Essential hypertension   NSTEMI (non-ST elevated myocardial infarction) (HCC)   Type 2 diabetes mellitus, controlled (HCC) - Primary    Relevant Orders   POCT glycosylated hemoglobin (Hb A1C) (Completed)    A1c today 6.6%.  This has been slowly inching up  over the past couple years.  We did discuss possible treatment options- especially consider medications that have positive cardiovascular endpoints and at this point he wishes to wait.  We recommend try to establish more consistent exercise and try to drop a few pounds.  Discussed diet with handout given.  Recommend 42-month follow-up.  If A1c climbing at that point consider low-dose Metformin versus SGLT2 or GLP-1 medication options  No orders of the defined types were placed in this encounter.   Follow-up: Return in about 6 months (around 09/12/2020).    Evelena Peat, MD

## 2020-06-26 ENCOUNTER — Other Ambulatory Visit: Payer: Self-pay | Admitting: Family Medicine

## 2020-06-26 DIAGNOSIS — I10 Essential (primary) hypertension: Secondary | ICD-10-CM

## 2020-09-14 ENCOUNTER — Ambulatory Visit: Payer: 59 | Admitting: Family Medicine

## 2021-09-06 ENCOUNTER — Ambulatory Visit: Payer: 59 | Admitting: Podiatry

## 2021-09-06 DIAGNOSIS — B351 Tinea unguium: Secondary | ICD-10-CM

## 2021-09-06 DIAGNOSIS — M79676 Pain in unspecified toe(s): Secondary | ICD-10-CM

## 2021-09-07 NOTE — Progress Notes (Signed)
He presents today chief complaint of painful elongated toenails.  Objective: Toenails are long thick yellow dystrophic onychomycotic no open lesions or wounds.  Assessment: Painful elongated toenails.  Plan: Debrided nails 1 through 5 bilateral.

## 2021-12-14 ENCOUNTER — Ambulatory Visit: Payer: 59 | Admitting: Family Medicine

## 2021-12-14 ENCOUNTER — Encounter: Payer: Self-pay | Admitting: Family Medicine

## 2021-12-14 VITALS — BP 130/70 | HR 78 | Temp 97.4°F | Ht 70.0 in | Wt 209.0 lb

## 2021-12-14 DIAGNOSIS — B079 Viral wart, unspecified: Secondary | ICD-10-CM

## 2021-12-14 DIAGNOSIS — L858 Other specified epidermal thickening: Secondary | ICD-10-CM

## 2021-12-14 NOTE — Patient Instructions (Signed)
Suspect cutaneous horn.     Will be in touch after pathology back  Keep wound dry for the first 24 hours then clean daily with soap and water for one week. Apply vaseline daily for 3-4 days. Follow up promptly for any signs of infection such as redness, warmth, pain, or drainage.

## 2021-12-14 NOTE — Progress Notes (Signed)
Established Patient Office Visit  Subjective   Patient ID: Peter Hughes, male    DOB: June 13, 1955  Age: 66 y.o. MRN: [id number]  Chief Complaint  Patient presents with   Skin issue     X2 months    HPI   Peter Hughes is seen with growth which came up the left side of the nose on the lateral naris about a month ago.  Rapid growth with hyperkeratotic horn near the center.  No prior history of skin cancer.  No bleeding.  No pain.  He does take clopidogrel.  No anticoagulants.  Past Medical History:  Diagnosis Date   EPIDIDYMITIS 08/03/2009   HYPERTENSION 08/13/2008   Past Surgical History:  Procedure Laterality Date   HAND SURGERY  2005   crush injury   lipotripsy  2006   Kidney stone    reports that he has never smoked. He has never used smokeless tobacco. He reports that he does not drink alcohol and does not use drugs. family history includes Alcohol abuse in an other family member; Cancer in an other family member; Diabetes in an other family member; Hypertension in an other family member. Allergies  Allergen Reactions   Benadryl [Diphenhydramine Hcl]     agitation   Diphenhydramine Hcl     Reaction: agitation, a cough green syrup    Review of Systems  Constitutional:  Negative for weight loss.  Cardiovascular:  Negative for chest pain.      Objective:     BP 130/70 (BP Location: Left Arm, Patient Position: Sitting, Cuff Size: Large)   Pulse 78   Temp (!) 97.4 F (36.3 C) (Oral)   Ht 5\' 10"  (1.778 m)   Wt 209 lb (94.8 kg)   SpO2 98%   BMI 29.99 kg/m  BP Readings from Last 3 Encounters:  12/14/21 130/70  03/12/20 120/70  09/09/19 110/60   Wt Readings from Last 3 Encounters:  12/14/21 209 lb (94.8 kg)  03/12/20 196 lb (88.9 kg)  01/14/20 196 lb (88.9 kg)      Physical Exam Vitals reviewed.  Cardiovascular:     Rate and Rhythm: Normal rate and regular rhythm.  Skin:    Comments: Left side of nose reveals hyperkeratotic cutaneous horn which is about 1 cm  in length.  Near the base about 3 mm.  Neurological:     Mental Status: He is alert.      No results found for any visits on 12/14/21.    The ASCVD Risk score (Arnett DK, et al., 2019) failed to calculate for the following reasons:   The patient has a prior MI or stroke diagnosis    Assessment & Plan:   Problem List Items Addressed This Visit   None Visit Diagnoses     Cutaneous horn    -  Primary   Relevant Orders   Dermatology pathology     Continuous horn left side of nose.  Patient would like to have excision and we recommended excising by shave biopsy and sending off pathology to rule out any atypical or squamous cell carcinoma changes  Discussed risk and benefits of shave excision including risk of bleeding and low risk of infection.  Patient consented.  Local anesthesia with 1% plain Xylocaine.  Prepped skin with alcohol swab.  Removed with shave excision with #15 blade.  Had some bleeding afterwards.  We were unable to stop bleeding with Drysol and used silver nitrate swab with prompt resolution of bleeding.  Topical Vaseline applied  Wound care instruction given Specimen to [facility] pathology  No follow-ups on file.    Carolann Littler, MD

## 2022-05-09 ENCOUNTER — Encounter: Payer: Self-pay | Admitting: Podiatry

## 2022-05-09 ENCOUNTER — Ambulatory Visit: Payer: 59 | Admitting: Podiatry

## 2022-05-09 DIAGNOSIS — B351 Tinea unguium: Secondary | ICD-10-CM

## 2022-05-09 DIAGNOSIS — M79676 Pain in unspecified toe(s): Secondary | ICD-10-CM

## 2022-05-09 NOTE — Progress Notes (Signed)
Presents today chief complaint of painful elongated toenails.  Objective: Vital signs stable alert oriented x 3  Negative salines drainage odor toenails are long thick yellow dystrophic onychomycotic sharply incurvated painful palpation as well as debridement.  No open lesions or wounds are noted.  Pulses are palpable.  Assessment: Pain limb secondary to onychomycosis.  Plan: Debridement of toenails 1 through 5 bilateral

## 2022-05-31 ENCOUNTER — Telehealth: Payer: Self-pay | Admitting: Family Medicine

## 2022-05-31 NOTE — Telephone Encounter (Signed)
Pt states he is having balance problems, declined OV says he has tested positive for covid, declined VV says he doesn't do that. Wants someone to call him

## 2022-05-31 NOTE — Telephone Encounter (Signed)
I spoke with the patient and he reported balance issues, x3 days. Patient reported no vision changes at this time. I offered patient a virtual visit to discuss symptoms but patient declined and stated he does not do virtual visits. I informed the patient to visit the nearest ED or urgent care for treatment if symptoms worsen.

## 2022-05-31 NOTE — Telephone Encounter (Signed)
Patient also informed me he tested positive for Covid and stated he would just wait it out.

## 2022-06-01 ENCOUNTER — Telehealth: Payer: Self-pay | Admitting: Podiatry

## 2022-06-01 NOTE — Telephone Encounter (Signed)
Pt wanted you to call him at [phone]

## 2022-09-28 ENCOUNTER — Ambulatory Visit (INDEPENDENT_AMBULATORY_CARE_PROVIDER_SITE_OTHER): Payer: Medicare Other | Admitting: Family Medicine

## 2022-09-28 ENCOUNTER — Encounter: Payer: Self-pay | Admitting: Family Medicine

## 2022-09-28 VITALS — BP 138/80 | HR 74 | Temp 98.1°F | Ht 70.0 in | Wt 200.0 lb

## 2022-09-28 DIAGNOSIS — M7522 Bicipital tendinitis, left shoulder: Secondary | ICD-10-CM

## 2022-09-28 MED ORDER — METHOCARBAMOL 500 MG PO TABS
500.0000 mg | ORAL_TABLET | Freq: Three times a day (TID) | ORAL | 0 refills | Status: AC | PRN
Start: 2022-09-28 — End: ?

## 2022-09-28 MED ORDER — DICLOFENAC SODIUM 1 % EX GEL
CUTANEOUS | 0 refills | Status: DC
Start: 2022-09-28 — End: 2022-10-25

## 2022-09-28 NOTE — Progress Notes (Signed)
Established Patient Office Visit   Subjective:  Patient ID: Peter Hughes, male    DOB: March 07, 1955  Age: 67 y.o. MRN: [id number]  Chief Complaint  Patient presents with   Hospitalization Follow-up    Left should stiffness and pain x 1 day, Pt went to [facility]. And has a appointment with cardioloist on Monday. Pt has a heart attack in 2001.    HPI Encounter Diagnoses  Name Primary?   Biceps tendinitis of left upper extremity Yes   2-day history of left shoulder pain.  He can think of no injury.  He was seen in the emergency room last evening and was ruled out for an MI.  Status post MI in 2021 when his presenting symptom had been left shoulder pain.  He has no shortness of breath, difficulty breathing, nausea or diaphoresis.  Scheduled for cardiology follow-up on Monday.  He is right-hand dominant.  No prior injury to his left shoulder.   Review of Systems  Constitutional: Negative.  Negative for diaphoresis.  HENT: Negative.    Eyes:  Negative for blurred vision, discharge and redness.  Respiratory: Negative.  Negative for shortness of breath.   Cardiovascular: Negative.   Gastrointestinal:  Negative for abdominal pain, nausea and vomiting.  Genitourinary: Negative.   Musculoskeletal:  Positive for joint pain. Negative for myalgias.  Skin:  Negative for rash.  Neurological:  Negative for tingling, loss of consciousness and weakness.  Endo/Heme/Allergies:  Negative for polydipsia.     Current Outpatient Medications:    amLODipine (NORVASC) 5 MG tablet, TAKE 1 TABLET BY MOUTH EVERY DAY, Disp: 30 tablet, Rfl: 6   ASPIRIN LOW DOSE 81 MG EC tablet, Take 81 mg by mouth daily., Disp: , Rfl:    atorvastatin (LIPITOR) 40 MG tablet, Take 40 mg by mouth at bedtime., Disp: , Rfl:    clopidogrel (PLAVIX) 75 MG tablet, Take 75 mg by mouth daily., Disp: , Rfl:    diclofenac Sodium (VOLTAREN ARTHRITIS PAIN) 1 % GEL, Massage a small grape sized dollop into the tender area 4 times daily as  needed., Disp: 200 g, Rfl: 0   lisinopril (ZESTRIL) 10 MG tablet, Take 10 mg by mouth daily., Disp: , Rfl:    methocarbamol (ROBAXIN) 500 MG tablet, Take 1 tablet (500 mg total) by mouth every 8 (eight) hours as needed for muscle spasms., Disp: 30 tablet, Rfl: 0   metoprolol succinate (TOPROL-XL) 25 MG 24 hr tablet, Take 25 mg by mouth daily., Disp: , Rfl:    nitroGLYCERIN (NITROSTAT) 0.4 MG SL tablet, Place under the tongue., Disp: , Rfl:    BRILINTA 90 MG TABS tablet, Take 90 mg by mouth 2 (two) times daily. (Patient not taking: Reported on 09/28/2022), Disp: , Rfl:    Objective:     BP 138/80   Pulse 74   Temp 98.1 F (36.7 C)   Ht 5\' 10"  (1.778 m)   Wt 200 lb (90.7 kg)   SpO2 97%   BMI 28.70 kg/m  Wt Readings from Last 3 Encounters:  09/28/22 200 lb (90.7 kg)  12/14/21 209 lb (94.8 kg)  03/12/20 196 lb (88.9 kg)      Physical Exam Constitutional:      General: He is not in acute distress.    Appearance: Normal appearance. He is not ill-appearing, toxic-appearing or diaphoretic.  HENT:     Head: Normocephalic and atraumatic.     Right Ear: External ear normal.     Left Ear: External ear  normal.  Eyes:     General: No scleral icterus.       Right eye: No discharge.        Left eye: No discharge.     Extraocular Movements: Extraocular movements intact.     Conjunctiva/sclera: Conjunctivae normal.  Pulmonary:     Effort: Pulmonary effort is normal. No respiratory distress.  Musculoskeletal:     Right shoulder: Normal range of motion.     Left shoulder: Tenderness present. Normal range of motion. Normal strength.       Arms:  Skin:    General: Skin is warm and dry.  Neurological:     Mental Status: He is alert and oriented to person, place, and time.  Psychiatric:        Mood and Affect: Mood normal.        Behavior: Behavior normal.      No results found for any visits on 09/28/22.    The ASCVD Risk score (Arnett DK, et al., 2019) failed to calculate for  the following reasons:   The patient has a prior MI or stroke diagnosis    Assessment & Plan:   Biceps tendinitis of left upper extremity -     Diclofenac Sodium; Massage a small grape sized dollop into the tender area 4 times daily as needed.  Dispense: 200 g; Refill: 0 -     Methocarbamol; Take 1 tablet (500 mg total) by mouth every 8 (eight) hours as needed for muscle spasms.  Dispense: 30 tablet; Refill: 0 -     Ambulatory referral to Sports Medicine    Return Will follow-up with Dr. Caryl Never as needed.Mliss Sax, MD

## 2022-10-03 ENCOUNTER — Ambulatory Visit (INDEPENDENT_AMBULATORY_CARE_PROVIDER_SITE_OTHER): Payer: Medicare Other | Admitting: Family Medicine

## 2022-10-03 ENCOUNTER — Other Ambulatory Visit: Payer: Self-pay

## 2022-10-03 ENCOUNTER — Encounter: Payer: Self-pay | Admitting: Family Medicine

## 2022-10-03 ENCOUNTER — Ambulatory Visit (INDEPENDENT_AMBULATORY_CARE_PROVIDER_SITE_OTHER): Payer: Medicare Other

## 2022-10-03 VITALS — BP 130/82 | HR 63 | Ht 70.0 in | Wt 204.4 lb

## 2022-10-03 DIAGNOSIS — M5412 Radiculopathy, cervical region: Secondary | ICD-10-CM

## 2022-10-03 DIAGNOSIS — M79622 Pain in left upper arm: Secondary | ICD-10-CM | POA: Diagnosis not present

## 2022-10-03 MED ORDER — PREDNISONE 50 MG PO TABS: 50.0000 mg | ORAL_TABLET | Freq: Every day | ORAL | 0 refills | Status: DC

## 2022-10-03 NOTE — Progress Notes (Signed)
I, Stevenson Clinch, CMA acting as a scribe for Clementeen Graham, MD.  Peter Hughes is a 67 y.o. male who presents to [facility] at [facility] today for left biceps tendonitis. Sx started around 09/26/22. Notes stiffness in the arm. Had eval with PCP and was prescribed muscle relaxer and topical Voltaren. Notes some improvement of sx with muscle relaxer.  Notes that sx are exacerbating by driving with the left hand or when propping the left arm up on the car door window seal.   Today patient reports continued left biceps and shoulder pain. ROM overall well maintained. Occasional sharp shooting pain. Some pain in the neck and upper back. Denies n/t, weakness, decreased grip strength.   Today, patient reports   Pertinent review of systems: No fevers or chills  Relevant historical information: Diabetes.  History of NSTEMI 2021   Exam:  BP 130/82   Pulse 63   Ht 5\' 10"  (1.778 m)   Wt 204 lb 6.4 oz (92.7 kg)   SpO2 97%   BMI 29.33 kg/m  General: Well Developed, well nourished, and in no acute distress.   MSK: C-spine: Normal appearing Nontender to palpation. Decreased cervical motion especially the lateral flexion bilaterally. Upper extremity strength and reflexes are intact. Minimally positive Spurling's test. Capillary refill and sensation and pulses are intact distally.  Left shoulder: Normal-appearing Nontender. Normal motion. Intact strength. Negative impingement testing. Negative speeds and Yergason's Test.    Lab and Radiology Results  X-ray images cervical spine and left shoulder obtained today personally and independently interpreted  C-spine: No severe DJD.  No acute fractures are visible.  Left shoulder: Mild glenohumeral DJD.  No acute fractures are visible.  Await formal radiology review    Assessment and Plan: 67 y.o. male with chronic left shoulder pain radiating to the upper arm some possibly into the forearm.  Based on his  constellation of symptoms and physical exam I think cervical radiculopathy is more likely than biceps tendinitis or shoulder rotator cuff tendinopathy.  Likely nerve root involvement of the C5 or perhaps C7. We discussed options.  Plan for trial of physical therapy.  Additionally I have prescribed prednisone.  We could use gabapentin if needed although he is getting pretty good relief currently from methocarbamol. Recheck in 6 weeks.  If worsening let me know sooner could proceed to MRI sooner if needed.   PDMP not reviewed this encounter. Orders Placed This Encounter  Procedures   DG Shoulder Left    Standing Status:   Future    Number of Occurrences:   1    Standing Expiration Date:   10/03/2023    Order Specific Question:   Reason for Exam (SYMPTOM  OR DIAGNOSIS REQUIRED)    Answer:   eval left shoulder pain    Order Specific Question:   Preferred imaging location?    Answer:   [facility]   DG Cervical Spine 2 or 3 views    Standing Status:   Future    Number of Occurrences:   1    Standing Expiration Date:   10/03/2023    Order Specific Question:   Reason for Exam (SYMPTOM  OR DIAGNOSIS REQUIRED)    Answer:   eval left cervical rad pain    Order Specific Question:   Preferred imaging location?    Answer:   [facility]   Ambulatory referral to Physical Therapy    Referral Priority:   Routine    Referral  Type:   Physical Medicine    Referral Reason:   Specialty Services Required    Requested Specialty:   Physical Therapy    Number of Visits Requested:   1   Meds ordered this encounter  Medications   predniSONE (DELTASONE) 50 MG tablet    Sig: Take 1 tablet (50 mg total) by mouth daily.    Dispense:  5 tablet    Refill:  0     Discussed warning signs or symptoms. Please see discharge instructions. Patient expresses understanding.   The above documentation has been reviewed and is accurate and complete Clementeen Graham, M.D.

## 2022-10-03 NOTE — Patient Instructions (Signed)
Thank you for coming in today.   Please get an Xray today before you leave   I've referred you to Physical Therapy.  Let us know if you don't hear from them in one week.   Try the prednisone for 5 days. OK to stop it early if you dont like it.   I can add gabapentin for nerve pain if needed but it sounds like the methocarbimol is working ok.   If you need a refill of it let me know.   Recheck in about 6 weeks.   Let me know sooner if this is not working or you are having problems.

## 2022-10-23 ENCOUNTER — Telehealth: Payer: Self-pay

## 2022-10-23 NOTE — Telephone Encounter (Signed)
Called pt and informed that the CD w/ him XR images is ready for pick up at his convenience. He stated that he would likely swing by sometime tomorrow morning.

## 2022-10-23 NOTE — Progress Notes (Signed)
Left shoulder x-ray shows evidence of rotator cuff tendinopathy and a little bit of arthritis in the shoulder joint.

## 2022-10-23 NOTE — Progress Notes (Signed)
Cervical spine x-ray shows some arthritis and evidence of muscle spasm.

## 2022-10-25 ENCOUNTER — Other Ambulatory Visit: Payer: Self-pay | Admitting: Family Medicine

## 2022-10-25 DIAGNOSIS — M7522 Bicipital tendinitis, left shoulder: Secondary | ICD-10-CM

## 2022-11-09 ENCOUNTER — Ambulatory Visit: Payer: Medicare Other | Admitting: Podiatry

## 2022-11-09 ENCOUNTER — Encounter: Payer: Self-pay | Admitting: Podiatry

## 2022-11-09 DIAGNOSIS — M79676 Pain in unspecified toe(s): Secondary | ICD-10-CM | POA: Diagnosis not present

## 2022-11-09 DIAGNOSIS — B351 Tinea unguium: Secondary | ICD-10-CM

## 2022-11-11 NOTE — Progress Notes (Signed)
Presents today chief complaint of painful elongated toenails.  Objective: Pulses are strong and palpable.  Neurologic sensorium is intact no erythema cellulitis drainage or odor no open lesions or wounds.  Nails are long thick yellow dystrophic onychomycotic.  Assessment: Pain limb secondary onychomycosis.  Plan: Debridement of toenails 1 through 5 bilateral.

## 2022-11-14 ENCOUNTER — Ambulatory Visit: Payer: Medicare Other | Admitting: Family Medicine

## 2023-02-13 ENCOUNTER — Ambulatory Visit: Payer: Medicare Other | Admitting: Podiatry

## 2023-03-13 ENCOUNTER — Ambulatory Visit (INDEPENDENT_AMBULATORY_CARE_PROVIDER_SITE_OTHER): Payer: Medicare Other | Admitting: Podiatry

## 2023-03-13 ENCOUNTER — Encounter: Payer: Self-pay | Admitting: Podiatry

## 2023-03-13 DIAGNOSIS — D2372 Other benign neoplasm of skin of left lower limb, including hip: Secondary | ICD-10-CM

## 2023-03-13 DIAGNOSIS — M79676 Pain in unspecified toe(s): Secondary | ICD-10-CM | POA: Diagnosis not present

## 2023-03-13 DIAGNOSIS — B351 Tinea unguium: Secondary | ICD-10-CM | POA: Diagnosis not present

## 2023-03-13 DIAGNOSIS — D2371 Other benign neoplasm of skin of right lower limb, including hip: Secondary | ICD-10-CM

## 2023-03-13 NOTE — Progress Notes (Signed)
 He presents today chief plaint of painful elongated toenails and calluses to the plantar aspect of the fifth metatarsals bilaterally.  Denies any other foot issues.  Objective: Pulses are palpable bilateral.  Toenails are long thick yellow dystrophic with mycotic painful palpation as well as debridement.  Benign skin lesion subfifth metatarsal head bilateral right greater than left with a porokeratotic punctated lesion.  Assessment: Pain in limb secondary to onychomycosis and benign skin lesions.  Plan: Debridement of benign skin lesions and debridement of toenails 1 through 5 bilateral.  Follow-up 4 months

## 2023-07-06 ENCOUNTER — Telehealth: Payer: Self-pay | Admitting: Podiatry

## 2023-07-06 NOTE — Telephone Encounter (Signed)
 Called patient and left message on machine that Dr. Verta will not be in office on 7/8 and will need to call us  and reschedule his appointment.

## 2023-07-17 ENCOUNTER — Ambulatory Visit: Admitting: Podiatry

## 2023-08-09 ENCOUNTER — Ambulatory Visit (INDEPENDENT_AMBULATORY_CARE_PROVIDER_SITE_OTHER): Admitting: Podiatry

## 2023-08-09 DIAGNOSIS — B351 Tinea unguium: Secondary | ICD-10-CM | POA: Diagnosis not present

## 2023-08-09 DIAGNOSIS — M79676 Pain in unspecified toe(s): Secondary | ICD-10-CM

## 2023-08-09 NOTE — Progress Notes (Signed)
 He presents today chief complaint of painful elongated toenails.  Objective: Toenails are long thick yellow dystrophic like mycotic painful palpation as well as debridement.  Assessment: Pain limb secondary onychomycosis 1 through 5 bilateral.  Plan: Debridement of toenails 1 through 5 bilateral.  Follow-up with him in 3 months

## 2023-09-21 ENCOUNTER — Encounter: Payer: Self-pay | Admitting: Family Medicine

## 2023-09-21 ENCOUNTER — Ambulatory Visit: Admitting: Family Medicine

## 2023-09-21 VITALS — BP 156/70 | HR 68 | Temp 97.6°F | Wt 199.7 lb

## 2023-09-21 DIAGNOSIS — R739 Hyperglycemia, unspecified: Secondary | ICD-10-CM

## 2023-09-21 DIAGNOSIS — E1165 Type 2 diabetes mellitus with hyperglycemia: Secondary | ICD-10-CM | POA: Diagnosis not present

## 2023-09-21 DIAGNOSIS — R1033 Periumbilical pain: Secondary | ICD-10-CM

## 2023-09-21 LAB — POCT GLYCOSYLATED HEMOGLOBIN (HGB A1C): Hemoglobin A1C: 12.5 % — AB (ref 4.0–5.6)

## 2023-09-21 MED ORDER — METFORMIN HCL 500 MG PO TABS: 500.0000 mg | ORAL_TABLET | Freq: Two times a day (BID) | ORAL | 3 refills | Status: DC

## 2023-09-21 NOTE — Patient Instructions (Signed)
 I will be setting up diabetes nutrition consult  Start the Metformin  500 mg twice daily.   Set up one month follow up.

## 2023-09-21 NOTE — Progress Notes (Unsigned)
 Established Patient Office Visit  Subjective   Patient ID: Peter Hughes, male    DOB: 1955/02/28  Age: 68 y.o. MRN: [id number]  Chief Complaint  Patient presents with   Hospitalization Follow-up    HPI  {History (Optional):23778} Peter Hughes is seen today for recent ER follow-up.  He was seen in ER through [facility] in [place] couple days ago with some abdominal pain.  He states that his abdominal pain was relatively mild.  He developed some pain just inferior to the umbilicus radiating inferior suprapubic region.  He started reading and was concerned that he may have appendicitis.  His pain had all resolved by the time he got to the ER.  He had had some recent gaseous pains.  Has had no pain since discharge from the ER.  ER workup reviewed.  His white count was 10,000.  He had glucose of 263 but nonfasting.  Urinalysis did reveal glucose urea.  CT scan showed unremarkable appendix.  There was mention of filling defect within the inferior aspect of the superior mesenteric artery with possible noncalcified plaque contributing to some mild stenosis.  Patient denies any recent bloody diarrhea or fever.  CT showed no acute pathology.  He did have also nonobstructing right renal calculi  He does relate some recent increased thirst.  Has had history of prediabetes to early diabetes blood sugars previously but not monitoring at home.  Has not been here in quite some time.  Past Medical History:  Diagnosis Date   EPIDIDYMITIS 08/03/2009   HYPERTENSION 08/13/2008   Past Surgical History:  Procedure Laterality Date   HAND SURGERY  2005   crush injury   lipotripsy  2006   Kidney stone    reports that he has never smoked. He has never used smokeless tobacco. He reports that he does not drink alcohol and does not use drugs. family history includes Alcohol abuse in an other family member; Cancer in an other family member; Diabetes in an other family member; Hypertension in an other family  member. Allergies  Allergen Reactions   Benadryl [Diphenhydramine Hcl]     agitation   Diphenhydramine Hcl     Reaction: agitation, a cough green syrup    Review of Systems  Constitutional:  Negative for chills and fever.  Respiratory:  Negative for cough.   Gastrointestinal:  Negative for abdominal pain, blood in stool, constipation, diarrhea, melena, nausea and vomiting.  Genitourinary:  Negative for dysuria.      Objective:     BP (!) 156/70   Pulse 68   Temp 97.6 F (36.4 C) (Oral)   Wt 199 lb 11.2 oz (90.6 kg)   SpO2 95%   BMI 28.65 kg/m  BP Readings from Last 3 Encounters:  09/21/23 (!) 156/70  10/03/22 130/82  09/28/22 138/80   Wt Readings from Last 3 Encounters:  09/21/23 199 lb 11.2 oz (90.6 kg)  10/03/22 204 lb 6.4 oz (92.7 kg)  09/28/22 200 lb (90.7 kg)      Physical Exam Vitals reviewed.  Constitutional:      General: He is not in acute distress.    Appearance: He is well-developed. He is not ill-appearing.  Eyes:     Pupils: Pupils are equal, round, and reactive to light.  Neck:     Thyroid : No thyromegaly.  Cardiovascular:     Rate and Rhythm: Normal rate and regular rhythm.  Pulmonary:     Effort: Pulmonary effort is normal. No respiratory distress.  Breath sounds: Normal breath sounds. No wheezing or rales.  Abdominal:     Comments: Abdomen is nondistended.  Normal bowel sounds.  Soft and nontender.  Musculoskeletal:     Cervical back: Neck supple.  Neurological:     Mental Status: He is alert and oriented to person, place, and time.      Results for orders placed or performed in visit on 09/21/23  POC HgB A1c  Result Value Ref Range   Hemoglobin A1C 12.5 (A) 4.0 - 5.6 %   HbA1c POC (<> result, manual entry)     HbA1c, POC (prediabetic range)     HbA1c, POC (controlled diabetic range)      {Labs (Optional):23779}  The ASCVD Risk score (Arnett DK, et al., 2019) failed to calculate for the following reasons:   Risk score  cannot be calculated because patient has a medical history suggesting prior/existing ASCVD    Assessment & Plan:   #1 recent transient periumbilical abdominal pain.  CT scan showed no acute abnormalities.  Did have mention of possible mild stenosis superior mesenteric artery as above but patient is not describing any recent ischemic type symptoms.  White count was normal.  No recent fever.  #2 type 2 diabetes with hyperglycemia.  He had recent nonfasting glucose 263 in the ER with glucosuria.  We obtained A1c today which came back 12.5 which is up dramatically from 6.6 last check.  Patient nonfasting today  -Stay well-hydrated - Home glucose monitor with test strips and lancets prescribed and monitor at least twice daily at least initially - Start metformin  500 mg twice daily - Set up referral to certified diabetes educator - Patient has a fairly good grasp on diet and we discussed in some detail important dietary aspects to consider. - Will plan office follow-up in 3 to 4 weeks to reassess and sooner as needed.  Will reassess fasting glucose and then and review home readings at that follow-up   Return in about 1 month (around 10/21/2023).    Wolm Scarlet, MD

## 2023-10-02 ENCOUNTER — Encounter: Attending: Family Medicine | Admitting: Skilled Nursing Facility1

## 2023-10-02 ENCOUNTER — Encounter: Payer: Self-pay | Admitting: Skilled Nursing Facility1

## 2023-10-02 VITALS — Wt 195.6 lb

## 2023-10-02 DIAGNOSIS — E1165 Type 2 diabetes mellitus with hyperglycemia: Secondary | ICD-10-CM | POA: Diagnosis present

## 2023-10-02 NOTE — Progress Notes (Signed)
 Patient was seen on 10/02/2023 for the first of a series of three diabetes self-management courses at [facility].  Patient Education Plan per assessed needs and concerns is to attend three course education program for Diabetes Self Management Education.  A1C was 12.5 on 09/2023.  The following learning objectives were met by the patient during this class: Describe diabetes, types of diabetes and pathophysiology State some common risk factors for diabetes Defines the role of glucose and insulin Describe the relationship between diabetes and cardiovascular and other risks State the members of the Healthcare Team States the rationale for glucose monitoring and when to test State their individual Target Range State the importance of logging glucose readings and how to interpret the readings Identifies A1C target Explain the correlation between A1c and eAG values State symptoms and treatment of high blood glucose and low blood glucose Explain proper technique for glucose testing and identify proper sharps disposal  Handouts given during class include: How to Thrive:  A Guide for Your Journey with Diabetes by the ADA Meal Plan Card and carbohydrate content list Dietary intake form Low Sodium Flavoring Tips Types of Fats Dining Out Label reading Snack list The diabetes portion plate Diabetes Resources A1c to eAG Conversion Chart Blood Glucose Log Diabetes Recommended Care Schedule Support Group Diabetes Success Plan Core Class Satisfaction Survey   Follow-Up Plan: Attend core 2

## 2023-10-04 ENCOUNTER — Ambulatory Visit: Payer: Self-pay

## 2023-10-04 MED ORDER — LANCETS MISC: 1 refills | Status: AC

## 2023-10-04 MED ORDER — BLOOD GLUCOSE TEST VI STRP: ORAL_STRIP | 1 refills | Status: AC

## 2023-10-04 NOTE — Telephone Encounter (Signed)
 Patient states tongue and throat swelling since last night---he states no difficulty breathing or swallowing & he states he does take Lisinopril & started taking Metformin  about two weeks ago  Patient also has not heard from his pharmacy about lancets and glucose testing equipment yet and wanted to check on that   FYI Only or Action Required?: Action required by provider: update on patient condition and requesting information about lancets and glucose testing equipment---he hasn't heard from his pharmacy yet about these.  Patient was last seen in primary care on 09/21/2023 by Micheal Wolm ORN, MD.  Called Nurse Triage reporting Facial Swelling and Oral Swelling.  Symptoms began last night.  Interventions attempted: Nothing.  Symptoms are: unchanged.  Triage Disposition: Call EMS 911 Now  Patient/caregiver understands and will follow disposition?: Yes---patient declined ambulance but states he is going to the Emergency Room       Copied from CRM [id number]. Topic: Clinical - Red Word Triage >> Oct 04, 2023  8:06 AM Avram MATSU wrote: Red Word that prompted transfer to Nurse Triage: tongue and throat is swollen    ----------------------------------------------------------------------- From previous Reason for Contact - Scheduling: Patient/patient representative is calling to schedule an appointment. Refer to attachments for appointment information. Reason for Disposition  Taking an ACE Inhibitor medicine (e.g., benazepril / LOTENSIN, captopril / CAPOTEN, enalapril / VASOTEC, lisinopril / ZESTRIL)  Answer Assessment - Initial Assessment Questions Patient denies difficulty speaking or breathing at this time but states he has not taken his Metformin  yet He states he started Metformin  two weeks ago Patient also states that he takes Lisinopril Patient is advised that with tongue swelling/throat swelling and taking Lisinopril and another new medication--it is recommended that he goes to  the Emergency Room He states that his tongue is swollen enough that he bit it accidentally This RN offered to call patient an ambulance but patient denies wanting an ambulance He is advised that if anything gets worse to call 911 but patient does agree to go to the Emergency Room at this time  Patient also has not heard from his pharmacy about lancets and glucose testing equipment yet and wanted to check on that     1. ONSET: When did the swelling start? (e.g., minutes, hours, days)     Last night 2. LOCATION: What part of the tongue is swollen?     tongue 3. SEVERITY: How swollen is it?     Swollen enough that he bit it 4. CAUSE: What do you think is causing the tongue swelling? (e.g., history of angioedema, allergies)     unsure 5. RECURRENT SYMPTOM: Have you had tongue swelling before? If Yes, ask: When was the last time? What happened that time?     ----- 6. OTHER SYMPTOMS: Do you have any other symptoms? (e.g., breathing difficulty, facial swelling)     Patient states tongue and throat swelling  Protocols used: Tongue Swelling-A-AH

## 2023-10-04 NOTE — Addendum Note (Signed)
 Addended by: METTA KRISTEN CROME on: 10/04/2023 10:01 AM   Modules accepted: Orders

## 2023-10-04 NOTE — Telephone Encounter (Signed)
 Left message for the patient to return my call.

## 2023-10-05 ENCOUNTER — Encounter: Payer: Self-pay | Admitting: Adult Health

## 2023-10-05 ENCOUNTER — Ambulatory Visit (INDEPENDENT_AMBULATORY_CARE_PROVIDER_SITE_OTHER): Admitting: Adult Health

## 2023-10-05 VITALS — BP 128/80 | HR 67 | Temp 98.5°F | Ht 70.0 in | Wt 195.0 lb

## 2023-10-05 DIAGNOSIS — I10 Essential (primary) hypertension: Secondary | ICD-10-CM

## 2023-10-05 DIAGNOSIS — T783XXD Angioneurotic edema, subsequent encounter: Secondary | ICD-10-CM | POA: Diagnosis not present

## 2023-10-05 NOTE — Progress Notes (Signed)
 Subjective:    Patient ID: Peter Hughes, male    DOB: 08-13-1955, 68 y.o.   MRN: [id number]  HPI 68 year old male who  has a past medical history of EPIDIDYMITIS (08/03/2009) and HYPERTENSION (08/13/2008).  He was seen in the emergency room yesterday at [facility] for swelling of his tongue that he noticed the night prior.  He has been on lisinopril for quite some time.  In the ER he denied chest pain, shortness of breath, rash, or difficulty swallowing.  His symptoms were very mild with minimal tongue swelling.  He did not have any angioedema of his lips.  He was given oral prednisone  in the ER and had no further swelling.  He was discharged home with a short course of prednisone  and advised to stop lisinopril his metoprolol dose was increased from 25 mg to 50 mg.   Today he reports that th swelling has resolved. He has not had any difficulty swallowing and feels well overall.    Review of Systems See HPI   Past Medical History:  Diagnosis Date   EPIDIDYMITIS 08/03/2009   HYPERTENSION 08/13/2008    Social History   Socioeconomic History   Marital status: Married    Spouse name: Not on file   Number of children: Not on file   Years of education: Not on file   Highest education level: Not on file  Occupational History   Not on file  Tobacco Use   Smoking status: Never   Smokeless tobacco: Never  Vaping Use   Vaping status: Never Used  Substance and Sexual Activity   Alcohol use: No    Alcohol/week: 0.0 standard drinks of alcohol   Drug use: No   Sexual activity: Not on file  Other Topics Concern   Not on file  Social History Narrative   Not on file   Social Drivers of Health   Financial Resource Strain: Not on file  Food Insecurity: No Food Insecurity (02/02/2021)   Received from [facility]   Hunger Vital Sign    Within the past 12 months, you worried that your food would run out before you got the money to buy more.: Never true    Within the past 12 months, the  food you bought just didn't last and you didn't have money to get more.: Never true  Transportation Needs: Not on file  Physical Activity: Not on file  Stress: Not on file  Social Connections: Unknown (05/20/2021)   Received from [facility]   Social Network    Social Network: Not on file  Intimate Partner Violence: Not At Risk (10/04/2023)   Received from [facility]   HITS    Over the last 12 months how often did your partner physically hurt you?: Never    Over the last 12 months how often did your partner insult you or talk down to you?: Never    Over the last 12 months how often did your partner threaten you with physical harm?: Never    Over the last 12 months how often did your partner scream or curse at you?: Never    Past Surgical History:  Procedure Laterality Date   HAND SURGERY  2005   crush injury   lipotripsy  2006   Kidney stone    Family History  Problem Relation Age of Onset   Alcohol abuse Other    Cancer Other        breast   Diabetes Other  Hypertension Other     Allergies  Allergen Reactions   Benadryl [Diphenhydramine Hcl]     agitation   Diphenhydramine Hcl     Reaction: agitation, a cough green syrup    Current Outpatient Medications on File Prior to Visit  Medication Sig Dispense Refill   amLODipine  (NORVASC ) 5 MG tablet TAKE 1 TABLET BY MOUTH EVERY DAY 30 tablet 6   ASPIRIN LOW DOSE 81 MG EC tablet Take 81 mg by mouth daily.     atorvastatin (LIPITOR) 40 MG tablet Take 40 mg by mouth at bedtime.     clopidogrel (PLAVIX) 75 MG tablet Take 75 mg by mouth daily.     diclofenac  Sodium (VOLTAREN ) 1 % GEL MASSAGE A SMALL GRAPE SIZED DOLLOP INTO THE TENDER AREA 4 TIMES DAILY AS NEEDED. 200 g 0   Glucose Blood (BLOOD GLUCOSE TEST STRIPS) STRP Use as directed to check blood sugar daily 100 strip 1   Lancets MISC Use as directed to check blood sugar daily 100 each 1   lisinopril (ZESTRIL) 10 MG tablet Take 10 mg by mouth daily.     metFORMIN   (GLUCOPHAGE ) 500 MG tablet Take 1 tablet (500 mg total) by mouth 2 (two) times daily with a meal. 60 tablet 3   methocarbamol  (ROBAXIN ) 500 MG tablet Take 1 tablet (500 mg total) by mouth every 8 (eight) hours as needed for muscle spasms. 30 tablet 0   metoprolol succinate (TOPROL-XL) 25 MG 24 hr tablet Take 25 mg by mouth daily.     nitroGLYCERIN (NITROSTAT) 0.4 MG SL tablet Place under the tongue.     No current facility-administered medications on file prior to visit.    BP 128/80   Pulse 67   Temp 98.5 F (36.9 C) (Oral)   Ht 5' 10 (1.778 m)   Wt 195 lb (88.5 kg)   SpO2 97%   BMI 27.98 kg/m       Objective:   Physical Exam Vitals and nursing note reviewed.  Constitutional:      Appearance: Normal appearance.  HENT:     Mouth/Throat:     Mouth: Mucous membranes are moist. No angioedema.     Tongue: Tongue does not deviate from midline.     Pharynx: Oropharynx is clear. Uvula midline. No pharyngeal swelling.  Cardiovascular:     Rate and Rhythm: Normal rate and regular rhythm.     Pulses: Normal pulses.     Heart sounds: Normal heart sounds.  Pulmonary:     Effort: Pulmonary effort is normal.     Breath sounds: Normal breath sounds.  Skin:    General: Skin is warm and dry.  Neurological:     General: No focal deficit present.     Mental Status: He is alert and oriented to person, place, and time.  Psychiatric:        Mood and Affect: Mood normal.        Behavior: Behavior normal.        Thought Content: Thought content normal.        Judgment: Judgment normal.       Assessment & Plan:  1. Angioedema, subsequent encounter (Primary) - resolved.  - Finish steroid course - Lisinopril added to allergy list   2. Essential hypertension - encouraged to check BP at home with increase in Metoprolol. Will hold off adding any additional BP medication at this time until we see how he responds to the increase in Metoprolol. He has a follow up  with his PCP in about 2  weeks   Darleene Shape, NP

## 2023-10-09 ENCOUNTER — Encounter: Payer: Self-pay | Admitting: Skilled Nursing Facility1

## 2023-10-09 ENCOUNTER — Encounter: Attending: Family Medicine | Admitting: Skilled Nursing Facility1

## 2023-10-09 DIAGNOSIS — E119 Type 2 diabetes mellitus without complications: Secondary | ICD-10-CM | POA: Diagnosis present

## 2023-10-09 NOTE — Progress Notes (Signed)
 Patient was seen on 10/09/2023 for the second of a series of three diabetes self-management courses at the [facility]. The following learning objectives were met by the patient during this class:  Describe the role of different macronutrients on glucose Explain how carbohydrates affect blood glucose State what foods contain the most carbohydrates Demonstrate carbohydrate counting Demonstrate how to read Nutrition Facts food label Describe effects of various fats on heart health Describe the importance of good nutrition for health and healthy eating strategies Describe techniques for managing your shopping, cooking and meal planning List strategies to follow meal plan when dining out Describe the effects of alcohol on glucose and how to use it safely  Goals:  Follow Diabetes Meal Plan as instructed  Eat 3 meals and 2 snacks, every 3-5 hrs  Limit carbohydrate intake to ~45 grams carbohydrate/meal Limit carbohydrate intake to 15 grams carbohydrate/snack Add lean protein foods to meals/snacks  Monitor glucose levels as instructed by your doctor   Follow-Up Plan: Attend Core 3 Work towards following your personal food plan.

## 2023-10-16 ENCOUNTER — Encounter: Attending: Family Medicine | Admitting: Skilled Nursing Facility1

## 2023-10-16 ENCOUNTER — Encounter: Payer: Self-pay | Admitting: Skilled Nursing Facility1

## 2023-10-16 DIAGNOSIS — E119 Type 2 diabetes mellitus without complications: Secondary | ICD-10-CM | POA: Diagnosis present

## 2023-10-16 NOTE — Progress Notes (Signed)
 Patient was seen on 10/16/2023 for the third of a series of three diabetes self-management courses at the [facility]. The following learning objectives were met by the patient during this class:  State the amount of activity recommended for healthy living Describe activities suitable for individual needs Identify ways to regularly incorporate activity into daily life Identify barriers to activity and ways to over come these barriers Identify diabetes medications being personally used and their primary action for lowering glucose and possible side effects Describe role of stress on blood glucose and develop strategies to address psychosocial issues Identify diabetes complications and ways to prevent them Explain how to manage diabetes during illness Evaluate success in meeting personal goal Establish 2-3 goals that they will plan to diligently work on until they return for the  26-month follow-up visit  Goals:  I will continue to ad din non starchy vegetables and be consistent with my physical activity    Plan:  Attend Monthly Diabetes Support Group as needed or make a future follow up appointment

## 2023-10-23 ENCOUNTER — Encounter: Payer: Self-pay | Admitting: Family Medicine

## 2023-10-23 ENCOUNTER — Ambulatory Visit: Admitting: Family Medicine

## 2023-10-23 VITALS — BP 130/70 | HR 64 | Temp 97.7°F | Wt 195.2 lb

## 2023-10-23 DIAGNOSIS — I10 Essential (primary) hypertension: Secondary | ICD-10-CM | POA: Diagnosis not present

## 2023-10-23 DIAGNOSIS — Z7984 Long term (current) use of oral hypoglycemic drugs: Secondary | ICD-10-CM | POA: Diagnosis not present

## 2023-10-23 DIAGNOSIS — E1165 Type 2 diabetes mellitus with hyperglycemia: Secondary | ICD-10-CM | POA: Diagnosis not present

## 2023-10-23 NOTE — Progress Notes (Signed)
 Established Patient Office Visit  Subjective   Patient ID: Peter Hughes, male    DOB: 12-09-1955  Age: 68 y.o. MRN: [id number]  Chief Complaint  Patient presents with   Medical Management of Chronic Issues    HPI   Rehaan seen today for follow-up regarding diabetes and recent angioedema episode.  He was seen here September 12 and blood sugars were quite high.  He had recent ER visit with nonfasting blood sugar 263.  We obtained A1c which was 12.5 which was up significantly from 6.6 last time this was checked which was a few years ago.  We started metformin  500 mg twice daily.  He is tolerating well.  He has had follow-up with diabetes educator.  His blood pressures at last visit we started lisinopril.  He developed angioedema and went to the ER for that.  Was taken off lisinopril and metoprolol was increased from 25 to 50 mg daily.  He also remains on amlodipine  5 mg daily.  Brings in log of blood sugars today and these are fairly consistently run 130s fasting and postprandial usually 170-180 range.  He has lost about 7-1/2 pounds.  He has reduced portion sizes and reducing overall carbs.  He states his blood sugars have improved fairly dramatically since starting the metformin  and making dietary changes.  He would like to reassess blood sugars before adding additional medication.  Does need diabetic eye exam  Past Medical History:  Diagnosis Date   EPIDIDYMITIS 08/03/2009   HYPERTENSION 08/13/2008   Past Surgical History:  Procedure Laterality Date   HAND SURGERY  2005   crush injury   lipotripsy  2006   Kidney stone    reports that he has never smoked. He has never used smokeless tobacco. He reports that he does not drink alcohol and does not use drugs. family history includes Alcohol abuse in an other family member; Cancer in an other family member; Diabetes in an other family member; Hypertension in an other family member. Allergies  Allergen Reactions   Benadryl  [Diphenhydramine Hcl]     agitation   Diphenhydramine Hcl     Reaction: agitation, a cough green syrup   Lisinopril Swelling    Mild angioedema     Review of Systems  Constitutional:  Positive for weight loss. Negative for malaise/fatigue.  Eyes:  Negative for blurred vision.  Respiratory:  Negative for shortness of breath.   Cardiovascular:  Negative for chest pain.  Neurological:  Negative for dizziness, weakness and headaches.      Objective:     BP 130/70 (BP Location: Left Arm, Cuff Size: Normal)   Pulse 64   Temp 97.7 F (36.5 C) (Oral)   Wt 195 lb 3.2 oz (88.5 kg)   SpO2 96%   BMI 28.01 kg/m  BP Readings from Last 3 Encounters:  10/23/23 130/70  10/05/23 128/80  09/21/23 (!) 156/70   Wt Readings from Last 3 Encounters:  10/23/23 195 lb 3.2 oz (88.5 kg)  10/05/23 195 lb (88.5 kg)  10/02/23 195 lb 9.6 oz (88.7 kg)      Physical Exam Vitals reviewed.  Constitutional:      Appearance: He is well-developed.  Eyes:     Pupils: Pupils are equal, round, and reactive to light.  Neck:     Thyroid : No thyromegaly.  Cardiovascular:     Rate and Rhythm: Normal rate and regular rhythm.  Pulmonary:     Effort: Pulmonary effort is normal. No respiratory distress.  Breath sounds: Normal breath sounds. No wheezing or rales.  Musculoskeletal:     Cervical back: Neck supple.  Neurological:     Mental Status: He is alert and oriented to person, place, and time.      No results found for any visits on 10/23/23.  Last CBC Lab Results  Component Value Date   WBC 9.2 09/09/2019   HGB 15.2 09/09/2019   HCT 45.3 09/09/2019   MCV 89.9 09/09/2019   MCH 30.2 09/09/2019   RDW 13.1 09/09/2019   PLT 329 09/09/2019   Last metabolic panel Lab Results  Component Value Date   GLUCOSE 110 (H) 09/09/2019   NA 139 09/09/2019   K 4.3 09/09/2019   CL 104 09/09/2019   CO2 23 09/09/2019   BUN 14 09/09/2019   CREATININE 0.89 09/09/2019   GFR 85.93 07/30/2017    CALCIUM 9.2 09/09/2019   PROT 6.7 09/09/2019   ALBUMIN 3.9 02/04/2013   BILITOT 0.8 09/09/2019   ALKPHOS 90 02/04/2013   AST 25 09/09/2019   ALT 47 (H) 09/09/2019   Last lipids Lab Results  Component Value Date   CHOL 81 09/09/2019   HDL 32 (L) 09/09/2019   LDLCALC 33 09/09/2019   TRIG 78 09/09/2019   CHOLHDL 2.5 09/09/2019   Last hemoglobin A1c Lab Results  Component Value Date   HGBA1C 12.5 (A) 09/21/2023      The ASCVD Risk score (Arnett DK, et al., 2019) failed to calculate for the following reasons:   Risk score cannot be calculated because patient has a medical history suggesting prior/existing ASCVD    Assessment & Plan:   #1 type 2 diabetes.  Recent poor control with A1c 12.5%.  We initiated metformin  and patient has been making some dietary changes and has lost about 7-1/2 pounds.  Home blood sugars improving.  -Needs urine microalbumin at follow-up - Recommend he set up diabetic eye exam and do this annually - Set up 22-month follow-up and recheck A1c then.  If not further to goal consider SGLT2 inhibitor especially with his past history of CAD  #2 recent angioedema episode probably related to lisinopril.  Avoid further use of ACE inhibitor and probably try to avoid ARB's as well.  #3 hypertension improved and stable today on increased metoprolol and amlodipine .  Continue current regimen and continue low-sodium diet.  His weight loss is probably also helping as well.  Wolm Scarlet, MD

## 2023-11-29 ENCOUNTER — Ambulatory Visit (INDEPENDENT_AMBULATORY_CARE_PROVIDER_SITE_OTHER): Admitting: Podiatry

## 2023-11-29 ENCOUNTER — Encounter: Payer: Self-pay | Admitting: Podiatry

## 2023-11-29 DIAGNOSIS — D689 Coagulation defect, unspecified: Secondary | ICD-10-CM | POA: Diagnosis not present

## 2023-11-29 DIAGNOSIS — E119 Type 2 diabetes mellitus without complications: Secondary | ICD-10-CM | POA: Diagnosis not present

## 2023-11-29 DIAGNOSIS — M79676 Pain in unspecified toe(s): Secondary | ICD-10-CM

## 2023-11-29 DIAGNOSIS — B351 Tinea unguium: Secondary | ICD-10-CM

## 2023-11-29 NOTE — Progress Notes (Signed)
 He presents today chief complaint of painful elongated toenails.  He denies fever chills nausea vomiting muscle aches and pains.  States that he has recently been diagnosed with diabetes mellitus type 2 his initial A1c was a 12.0.  Objective: Vital signs are stable he is alert and oriented x 3 pulses are strong and palpable.  No change in neurological status as of yet.  Muscle strength is normal symmetrical Semmes Weinstein monofilament is normal bilateral.  Toenails are long thick yellow dystrophic clinically mycotic no open lesions or wounds.  Assessment: Diabetes mellitus with no complications currently.  Pain in limb secondary to onychomycosis.  Plan: Debrided toenails 1 through 5 bilaterally provided him with education on diabetes and checking his feet dryness of the feet how to take care of that he understands and is amenable to it and I will follow-up with him on a regular schedule of about 3 months.

## 2023-12-07 ENCOUNTER — Other Ambulatory Visit: Payer: Self-pay | Admitting: Family Medicine

## 2023-12-24 ENCOUNTER — Ambulatory Visit: Admitting: Family Medicine

## 2023-12-24 ENCOUNTER — Encounter: Payer: Self-pay | Admitting: Family Medicine

## 2023-12-24 ENCOUNTER — Ambulatory Visit: Payer: Self-pay | Admitting: Family Medicine

## 2023-12-24 VITALS — BP 134/60 | HR 62 | Temp 97.5°F | Wt 199.9 lb

## 2023-12-24 DIAGNOSIS — E1165 Type 2 diabetes mellitus with hyperglycemia: Secondary | ICD-10-CM

## 2023-12-24 LAB — POCT GLYCOSYLATED HEMOGLOBIN (HGB A1C): Hemoglobin A1C: 7.7 % — AB (ref 4.0–5.6)

## 2023-12-24 LAB — MICROALBUMIN / CREATININE URINE RATIO
Creatinine,U: 49.2 mg/dL
Microalb Creat Ratio: 546.7 mg/g — ABNORMAL HIGH (ref 0.0–30.0)
Microalb, Ur: 26.9 mg/dL — ABNORMAL HIGH (ref 0.0–1.9)

## 2023-12-24 NOTE — Patient Instructions (Signed)
 A1C improved to 7.7 (improved from 12.5).   goal < 7.0.    Let's set up 3 month follow up.

## 2023-12-24 NOTE — Progress Notes (Signed)
 Established Patient Office Visit  Subjective   Patient ID: Peter Hughes, male    DOB: 22-Dec-1955  Age: 68 y.o. MRN: [id number]  Chief Complaint  Patient presents with   Medical Management of Chronic Issues    HPI   Peter Hughes has history of CAD, hypertension, type 2 diabetes.  Recent poor diabetes control with A1c 12.5%.  We started metformin .  Blood sugars much improved today with A1c 7.7% but still not to goal.  He has not had recent diabetic eye exam.  Needs urine microalbumin screen.  Generally feels well.  He has tried to be more diligent with diet.  He has lost some weight due to his efforts.  No polyuria or polydipsia.  He has history of anaphylaxis with lisinopril.  Blood pressure currently controlled with amlodipine  and metoprolol.  We discussed possible SGLT2 medication if not to goal at this check up.  He prefers to try to manage things much as he can with lifestyle change.  Past Medical History:  Diagnosis Date   EPIDIDYMITIS 08/03/2009   HYPERTENSION 08/13/2008   Past Surgical History:  Procedure Laterality Date   HAND SURGERY  2005   crush injury   lipotripsy  2006   Kidney stone    reports that he has never smoked. He has never used smokeless tobacco. He reports that he does not drink alcohol and does not use drugs. family history includes Alcohol abuse in an other family member; Cancer in an other family member; Diabetes in an other family member; Hypertension in an other family member. Allergies[1]  Review of Systems  Constitutional:  Negative for malaise/fatigue.  Eyes:  Negative for blurred vision.  Respiratory:  Negative for shortness of breath.   Cardiovascular:  Negative for chest pain.  Neurological:  Negative for dizziness, weakness and headaches.      Objective:     BP 134/60   Pulse 62   Temp (!) 97.5 F (36.4 C) (Oral)   Wt 199 lb 14.4 oz (90.7 kg)   SpO2 94%   BMI 28.68 kg/m  BP Readings from Last 3 Encounters:  12/24/23 134/60   10/23/23 130/70  10/05/23 128/80   Wt Readings from Last 3 Encounters:  12/24/23 199 lb 14.4 oz (90.7 kg)  10/23/23 195 lb 3.2 oz (88.5 kg)  10/05/23 195 lb (88.5 kg)      Physical Exam Vitals reviewed.  Constitutional:      Appearance: He is well-developed.  Neck:     Thyroid : No thyromegaly.  Cardiovascular:     Rate and Rhythm: Normal rate and regular rhythm.  Pulmonary:     Effort: Pulmonary effort is normal. No respiratory distress.     Breath sounds: Normal breath sounds. No wheezing or rales.  Musculoskeletal:     Cervical back: Neck supple.  Neurological:     Mental Status: He is alert and oriented to person, place, and time.      Results for orders placed or performed in visit on 12/24/23  POC HgB A1c  Result Value Ref Range   Hemoglobin A1C 7.7 (A) 4.0 - 5.6 %   HbA1c POC (<> result, manual entry)     HbA1c, POC (prediabetic range)     HbA1c, POC (controlled diabetic range)        The ASCVD Risk score (Arnett DK, et al., 2019) failed to calculate for the following reasons:   Risk score cannot be calculated because patient has a medical history suggesting prior/existing ASCVD   * -  Cholesterol units were assumed    Assessment & Plan:   Problem List Items Addressed This Visit   None Visit Diagnoses       Poorly controlled type 2 diabetes mellitus (HCC)    -  Primary   Relevant Orders   POC HgB A1c (Completed)   Microalbumin / creatinine urine ratio     Type 2 diabetes with recent poor control.  Recent A1c 12.5%.  Improved today to 7.7% with recent addition of metformin  and lifestyle changes.  We did discuss possible SGLT2 use.  Patient prefers to give this 3 more months to see if he can get below 7%.  We did recommend urine microalbumin screen today and if he is showing significant proteinuria we will suggest going ahead and starting Jardiance especially with his cardiac history.  Otherwise wait 3 months.  He is also encouraged to set up diabetic eye  exam.  Return in about 3 months (around 03/23/2024).    Wolm Scarlet, MD     [1]  Allergies Allergen Reactions   Benadryl [Diphenhydramine Hcl]     agitation   Diphenhydramine Hcl     Reaction: agitation, a cough green syrup   Lisinopril Swelling    Mild angioedema

## 2023-12-25 NOTE — Telephone Encounter (Signed)
 Pt returning call. Let pt know CMA is out at lunch.

## 2023-12-26 NOTE — Telephone Encounter (Signed)
 Per Peter Hughes pt states he has the results

## 2023-12-28 NOTE — Telephone Encounter (Signed)
 Pt walked in checking on the status of his Jardiance  stating that he gives the Dr approval to start him on the meds. With his verbal response he would like for the process to start so he can start taking the med.  Pt also stated that if anyone calls him and ask to provide his DOB he won't give that info out over the phone and will call us  back every time.   Please advise

## 2023-12-31 MED ORDER — EMPAGLIFLOZIN 10 MG PO TABS: 10.0000 mg | ORAL_TABLET | Freq: Every day | ORAL | 0 refills | Status: AC

## 2023-12-31 NOTE — Addendum Note (Signed)
 Addended by: METTA KRISTEN CROME on: 12/31/2023 09:44 AM   Modules accepted: Orders

## 2024-01-12 ENCOUNTER — Other Ambulatory Visit: Payer: Self-pay | Admitting: Family Medicine

## 2024-01-15 ENCOUNTER — Encounter: Payer: Self-pay | Admitting: *Deleted

## 2024-01-15 NOTE — Progress Notes (Signed)
 Peter Hughes                                          MRN: [id number]   01/15/2024   The [facility] Quality Team Specialist reviewed this patient medical record for the purposes of chart review for care gap closure. The following were reviewed: abstraction for care gap closure-glycemic status assessment.    [facility] Quality Team

## 2024-02-01 ENCOUNTER — Telehealth: Payer: Self-pay

## 2024-02-01 MED ORDER — ATORVASTATIN CALCIUM 40 MG PO TABS: 40.0000 mg | ORAL_TABLET | Freq: Every day | ORAL | 3 refills | Status: AC

## 2024-02-01 NOTE — Telephone Encounter (Signed)
 Let him know I have refilled his Atorvastatin-but does need follow-up lipid panel with next labs  Wolm LELON Scarlet MD [facility]

## 2024-02-01 NOTE — Addendum Note (Signed)
 Addended by: MICHEAL WOLM ORN on: 02/01/2024 04:12 PM   Modules accepted: Orders

## 2024-02-01 NOTE — Telephone Encounter (Signed)
 Copied from CRM #[id number]. Topic: Clinical - Prescription Issue >> Feb 01, 2024  1:55 PM Robinson H wrote: Reason for CRM: Patient is calling to have Dr. Micheal refill his atorvastatin (LIPITOR) 40 MG tablet states refill was denied by Cardiologist. Agent advised patient if Cardiologist was prescribing doctor he has to reach out to him, states he see's him sometimes but Dr. Micheal is his primary care doctor and wondering why he can't refill medication.  Keevon [phone]

## 2024-02-05 NOTE — Telephone Encounter (Signed)
 Left message for the patient to return my call.

## 2024-02-06 NOTE — Telephone Encounter (Signed)
 Left a message for the patient to return my call.

## 2024-02-15 NOTE — Telephone Encounter (Signed)
 Left a message for the patient to return my call.

## 2024-02-28 ENCOUNTER — Ambulatory Visit: Admitting: Podiatry

## 2024-03-24 ENCOUNTER — Ambulatory Visit: Admitting: Family Medicine
